# Patient Record
Sex: Male | Born: 1985 | Race: White | Hispanic: No | Marital: Single | Smoking: Never smoker
Health system: Southern US, Community
[De-identification: ages and names within clinical notes are randomized; demographics above are authoritative.]

## PROBLEM LIST (undated history)

## (undated) DIAGNOSIS — K219 Gastro-esophageal reflux disease without esophagitis: Secondary | ICD-10-CM

## (undated) DIAGNOSIS — F419 Anxiety disorder, unspecified: Secondary | ICD-10-CM

## (undated) HISTORY — DX: Anxiety disorder, unspecified: F41.9

## (undated) HISTORY — PX: OTHER SURGICAL HISTORY: SHX169

## (undated) HISTORY — DX: Gastro-esophageal reflux disease without esophagitis: K21.9

---

## 2010-08-08 HISTORY — PX: DISTAL CLAVICLE EXCISION: SHX1463

## 2017-06-22 ENCOUNTER — Encounter (HOSPITAL_COMMUNITY): Payer: Self-pay | Admitting: Family Medicine

## 2017-06-22 ENCOUNTER — Ambulatory Visit (HOSPITAL_COMMUNITY)
Admission: EM | Admit: 2017-06-22 | Discharge: 2017-06-22 | Disposition: A | Discharge status: Home / Self Care | Payer: BLUE CROSS/BLUE SHIELD | Attending: Emergency Medicine | Admitting: Emergency Medicine

## 2017-06-22 DIAGNOSIS — N485 Ulcer of penis: Secondary | ICD-10-CM | POA: Insufficient documentation

## 2017-06-22 DIAGNOSIS — Z880 Allergy status to penicillin: Secondary | ICD-10-CM | POA: Insufficient documentation

## 2017-06-22 DIAGNOSIS — N5089 Other specified disorders of the male genital organs: Secondary | ICD-10-CM

## 2017-06-22 MED ORDER — AZITHROMYCIN 250 MG PO TABS: ORAL_TABLET | ORAL | 0 refills | Status: DC

## 2017-06-22 NOTE — ED Triage Notes (Signed)
Pt here for possible abrasion or cut to penis. Reports thinks its friction based.

## 2017-06-22 NOTE — Discharge Instructions (Signed)
Have obtained test to help identify the source of the lesion. The diagnosis is uncertain. One possibility is chancroid. It can be treated with azithromycin. That is why you are receiving a prescription for this today.

## 2017-06-22 NOTE — ED Provider Notes (Signed)
[facility]    CSN: [id number] Arrival date & time: 06/22/17  1007     History   Chief Complaint Chief Complaint  Patient presents with  . Abrasion    HPI Brent Long is a 31 y.o. male.   31 year old male with penile abrasion for 2 weeks. He states that he believes it is due to friction with intercourse as this tends to exacerbate the symptoms and pain. The "abrasion" is a ovoid ulcer located at the base of the corona. No other lesions. Denies fatigue or malaise or feeling sick. No fever or chills. He states the lesion is tender but not particulary painful.      History reviewed. No pertinent past medical history.  There are no active problems to display for this patient.   History reviewed. No pertinent surgical history.     Home Medications    Prior to Admission medications   Medication Sig Start Date End Date Taking? Authorizing Provider  azithromycin (ZITHROMAX) 250 MG tablet 2 tabs po on day one, then one tablet po once daily on days 2-5. 06/22/17   Hayden RasmussenMabe, Lorena Clearman, NP    Family History Family History  Problem Relation Age of Onset  . Cancer Mother   . Cancer Father     Social History Social History   Tobacco Use  . Smoking status: Never Smoker  . Smokeless tobacco: Never Used  Substance Use Topics  . Alcohol use: Not on file  . Drug use: Not on file     Allergies   Penicillins   Review of Systems Review of Systems  Constitutional: Negative.   Genitourinary: Positive for genital sores. Negative for discharge, dysuria, flank pain, frequency, penile pain, penile swelling, scrotal swelling, testicular pain and urgency.  All other systems reviewed and are negative.    Physical Exam Triage Vital Signs ED Triage Vitals [06/22/17 1024]  Enc Vitals Group     BP (!) 146/85     Pulse Rate 81     Resp 18     Temp 98.5 F (36.9 C)     Temp src      SpO2 100 %     Weight      Height      Head Circumference      Peak Flow      Pain  Score      Pain Loc      Pain Edu?      Excl. in GC?    No data found.  Updated Vital Signs BP (!) 146/85   Pulse 81   Temp 98.5 F (36.9 C)   Resp 18   SpO2 100%   Visual Acuity Right Eye Distance:   Left Eye Distance:   Bilateral Distance:    Right Eye Near:   Left Eye Near:    Bilateral Near:     Physical Exam  Constitutional: He is oriented to person, place, and time. He appears well-developed and well-nourished. No distress.  Eyes: EOM are normal.  Neck: Normal range of motion. Neck supple.  Cardiovascular: Normal rate.  Pulmonary/Chest: Effort normal. No respiratory distress.  Genitourinary:  Genitourinary Comments: Normal phallus and normal external male genitalia. There is a ovoid ulcerative lesion at the base of the corona measuring approximately 5 mm in diameter. Mildly erythematous. Positive for tenderness. No bleeding. No lymphangitis. No regional lymphadenopathy. No scrotal or testicular pain or tenderness. No epididymal tenderness. No penile discharge.  Musculoskeletal: He exhibits no edema.  Neurological: He is  alert and oriented to person, place, and time. He exhibits normal muscle tone.  Skin: Skin is warm and dry.  Psychiatric: He has a normal mood and affect.  Nursing note and vitals reviewed.    UC Treatments / Results  Labs (all labs ordered are listed, but only abnormal results are displayed) Labs Reviewed  HSV CULTURE AND TYPING  RPR  HIV ANTIBODY (ROUTINE TESTING)    EKG  EKG Interpretation None       Radiology No results found.  Procedures Procedures (including critical care time)  Medications Ordered in UC Medications - No data to display   Initial Impression / Assessment and Plan / UC Course  I have reviewed the triage vital signs and the nursing notes.  Pertinent labs & imaging results that were available during my care of the patient were reviewed by me and considered in my medical decision making (see chart for  details).    Have obtained test to help identify the source of the lesion. The diagnosis is uncertain. One possibility is chancroid. It can be treated with azithromycin. That is why you are receiving a prescription for this today.     Final Clinical Impressions(s) / UC Diagnoses   Final diagnoses:  Ulcers of genital organ in male    ED Discharge Orders        Ordered    azithromycin (ZITHROMAX) 250 MG tablet     06/22/17 1113       Controlled Substance Prescriptions [place] Controlled Substance Registry consulted? Not Applicable   Hayden RasmussenMabe, Hellon Vaccarella, NP 06/22/17 1126

## 2017-06-23 LAB — HIV ANTIBODY (ROUTINE TESTING W REFLEX): HIV SCREEN 4TH GENERATION: NONREACTIVE

## 2017-06-23 LAB — RPR: RPR Ser Ql: NONREACTIVE

## 2017-06-24 ENCOUNTER — Encounter (HOSPITAL_COMMUNITY): Payer: Self-pay | Admitting: Family Medicine

## 2017-06-24 ENCOUNTER — Ambulatory Visit (HOSPITAL_COMMUNITY)
Admission: EM | Admit: 2017-06-24 | Discharge: 2017-06-24 | Disposition: A | Discharge status: Home / Self Care | Payer: BLUE CROSS/BLUE SHIELD | Attending: Family Medicine | Admitting: Family Medicine

## 2017-06-24 DIAGNOSIS — R509 Fever, unspecified: Secondary | ICD-10-CM | POA: Diagnosis not present

## 2017-06-24 DIAGNOSIS — J020 Streptococcal pharyngitis: Secondary | ICD-10-CM | POA: Diagnosis not present

## 2017-06-24 DIAGNOSIS — J029 Acute pharyngitis, unspecified: Secondary | ICD-10-CM | POA: Diagnosis not present

## 2017-06-24 LAB — POCT INFECTIOUS MONO SCREEN: Mono Screen: NEGATIVE

## 2017-06-24 LAB — POCT RAPID STREP A: STREPTOCOCCUS, GROUP A SCREEN (DIRECT): POSITIVE — AB

## 2017-06-24 MED ORDER — DEXAMETHASONE SODIUM PHOSPHATE 10 MG/ML IJ SOLN
INTRAMUSCULAR | Status: AC
  Filled 2017-06-24: qty 1

## 2017-06-24 MED ORDER — ACETAMINOPHEN 160 MG/5ML PO SOLN
ORAL | Status: AC
  Filled 2017-06-24: qty 20.3

## 2017-06-24 MED ORDER — DEXAMETHASONE SODIUM PHOSPHATE 10 MG/ML IJ SOLN
10.0000 mg | Freq: Once | INTRAMUSCULAR | Status: AC
  Administered 2017-06-24: 10 mg via INTRAMUSCULAR

## 2017-06-24 MED ORDER — PREDNISONE 50 MG PO TABS: ORAL_TABLET | ORAL | 0 refills | Status: DC

## 2017-06-24 MED ORDER — CEFDINIR 300 MG PO CAPS: 300.0000 mg | ORAL_CAPSULE | Freq: Two times a day (BID) | ORAL | 0 refills | Status: DC

## 2017-06-24 MED ORDER — ACETAMINOPHEN 160 MG/5ML PO SOLN
650.0000 mg | Freq: Once | ORAL | Status: AC
  Administered 2017-06-24: 650 mg via ORAL

## 2017-06-24 NOTE — ED Triage Notes (Signed)
Pt here for fever, sore throat for over a week and worsening. Reports very enlarged uvula

## 2017-06-24 NOTE — Discharge Instructions (Signed)
Stop taking the azithromycin now. [facility]. He will also have a prescription for prednisone. This is in case you break out into a rash or have itching. If you develop increased swelling of the throat, difficult to swallow or breathing call 911 or go to emergency department.

## 2017-06-24 NOTE — ED Provider Notes (Signed)
[facility]    CSN: [id number] Arrival date & time: 06/24/17  1856     History   Chief Complaint Chief Complaint  Patient presents with  . Sore Throat    HPI Brent Long is a 31 y.o. male.   31 year old male presents with sore throat, swelling of the uvula and posterior pharynx. He was seen for what was believed to be strep throat without a positive strep about 2 weeks ago and treated with Biaxin. It is reported that the culture for strep was negative. He was later seen in this urgent care for a lesion to the penis and test performed performed and azithromycin was prescribed as a Z-Pak for possible chancroid. He returns today on the third dose of his azithromycin with pharyngitis. No trouble breathing. Airway widely patent      History reviewed. No pertinent past medical history.  There are no active problems to display for this patient.   History reviewed. No pertinent surgical history.     Home Medications    Prior to Admission medications   Medication Sig Start Date End Date Taking? Authorizing Provider  cefdinir (OMNICEF) 300 MG capsule Take 1 capsule (300 mg total) 2 (two) times daily by mouth. 06/24/17   Alberto Pina, Onalee Huaavid, NP  predniSONE (DELTASONE) 50 MG tablet 1 tab po daily for 6 days. Take with food. 06/24/17   Hayden RasmussenMabe, Geraline Halberstadt, NP    Family History Family History  Problem Relation Age of Onset  . Cancer Mother   . Cancer Father     Social History Social History   Tobacco Use  . Smoking status: Never Smoker  . Smokeless tobacco: Never Used  Substance Use Topics  . Alcohol use: Not on file  . Drug use: Not on file     Allergies   Penicillins   Review of Systems Review of Systems  Constitutional: Positive for fever.  HENT: Positive for sore throat.   Respiratory: Negative.   Neurological: Negative.   All other systems reviewed and are negative.    Physical Exam Triage Vital Signs ED Triage Vitals  Enc Vitals Group     BP 06/24/17  1907 (!) 155/101     Pulse Rate 06/24/17 1907 (!) 123     Resp 06/24/17 1907 18     Temp 06/24/17 1907 (!) 101 F (38.3 C)     Temp src --      SpO2 06/24/17 1907 99 %     Weight --      Height --      Head Circumference --      Peak Flow --      Pain Score 06/24/17 1915 10     Pain Loc --      Pain Edu? --      Excl. in GC? --    No data found.  Updated Vital Signs BP (!) 155/101   Pulse (!) 123   Temp (!) 101 F (38.3 C)   Resp 18   SpO2 99%   Visual Acuity Right Eye Distance:   Left Eye Distance:   Bilateral Distance:    Right Eye Near:   Left Eye Near:    Bilateral Near:     Physical Exam  Constitutional: He is oriented to person, place, and time. He appears well-developed and well-nourished.  Non-toxic appearance. He does not appear ill. No distress.  HENT:  Mouth/Throat: Uvula swelling present. Posterior oropharyngeal erythema present. No oropharyngeal exudate or tonsillar abscesses. No tonsillar exudate.  Neck: Neck supple.  Pulmonary/Chest: Effort normal and breath sounds normal.  Lymphadenopathy:    He has cervical adenopathy.  Neurological: He is alert and oriented to person, place, and time.  Skin: Skin is warm and dry. No rash noted.  Psychiatric: He has a normal mood and affect.  Nursing note and vitals reviewed.    UC Treatments / Results  Labs (all labs ordered are listed, but only abnormal results are displayed) Labs Reviewed  POCT RAPID STREP A - Abnormal; Notable for the following components:      Result Value   Streptococcus, Group A Screen (Direct) POSITIVE (*)    All other components within normal limits  POCT INFECTIOUS MONO SCREEN  Allergy PCN with urticaria. Currently in the middle of second course of macrolides for pharyngitis in 2 weeks with worsening , erythema, mild swelling and discomfort. EKG  EKG Interpretation None       Radiology No results found.  Procedures Procedures (including critical care  time)  Medications Ordered in UC Medications  acetaminophen (TYLENOL) solution 650 mg (650 mg Oral Given 06/24/17 1920)  dexamethasone (DECADRON) injection 10 mg (10 mg Intramuscular Given 06/24/17 1933)     Initial Impression / Assessment and Plan / UC Course  I have reviewed the triage vital signs and the nursing notes.  Pertinent labs & imaging results that were available during my care of the patient were reviewed by me and considered in my medical decision making (see chart for details).       Final Clinical Impressions(s) / UC Diagnoses   Final diagnoses:  Strep pharyngitis    ED Discharge Orders        Ordered    cefdinir (OMNICEF) 300 MG capsule  2 times daily     06/24/17 1953    predniSONE (DELTASONE) 50 MG tablet     06/24/17 1953     Management discussed with Dr. Tracie HarrierHagler suggesting the above for treatment.  Controlled Substance Prescriptions [place] Controlled Substance Registry consulted? Not Applicable   Hayden RasmussenMabe, Rahkeem Senft, NP 06/24/17 [phone]

## 2017-06-25 LAB — HSV CULTURE AND TYPING

## 2017-12-18 ENCOUNTER — Ambulatory Visit: Payer: Self-pay | Admitting: Medical

## 2021-02-14 NOTE — Progress Notes (Signed)
New Patient Office Visit  Subjective:  Patient ID: Brent Long, male    DOB: 06-01-1986  Age: 35 y.o. MRN: [id number]  CC: Pulmonary embolism post-ER follow up   HPI Brent Long presents for a new patient consultation regarding a new diagnosis of acute pulmonary embolism. He has a past medical history of obesity, GERD, generalized anxiety and ADHD. On 02/07/21 he presented to the [facility] ER via private vehicle after a syncopal event while out on a trail with his family after attempting to lift a fallen log. After attempting to lift the fallen tree, he lost consciousness for a few minutes before coming to. He was driven to the trail entrance on a [place] and a family member drove him to the ER. In the ER he was anticoagulated with Xarelto and has been taking for 8 days as prescribed. He states that a few days prior to the event he was feeling more fatigued, with significant night sweats, headaches and was dyspneic prior to LOC. He tested positive for COVID in 2020 and recovered fully after 3 days. He has since received 2 doses of the [employer] vaccine and no known sick contacts preceding event. He works remotely from home and reports a sedentary lifestyle with poor dietary choices. He denies tobacco use and reports less than 1 alcoholic beverage per month on average.   Past Medical History:  Diagnosis Date   Anxiety    GERD (gastroesophageal reflux disease)     Past Surgical History:  Procedure Laterality Date   DISTAL CLAVICLE EXCISION  2012   jones fracture      Family History  Problem Relation Age of Onset   Cancer Mother    Cancer Father     Social History   Socioeconomic History   Marital status: Single    Spouse name: Not on file   Number of children: Not on file   Years of education: Not on file   Highest education level: Not on file  Occupational History   Not on file  Tobacco Use   Smoking status: Never   Smokeless tobacco: Never  Vaping Use   Vaping Use: Never used   Substance and Sexual Activity   Alcohol use: Yes    Comment: <1 drink per month on average   Drug use: Never   Sexual activity: Not on file  Other Topics Concern   Not on file  Social History Narrative   Not on file   Social Determinants of Health   Financial Resource Strain: Not on file  Food Insecurity: Not on file  Transportation Needs: Not on file  Physical Activity: Not on file  Stress: Not on file  Social Connections: Not on file  Intimate Partner Violence: Not on file    ROS Review of Systems  Constitutional:  Positive for fatigue. Negative for activity change and fever.  HENT: Negative.    Eyes:  Negative for photophobia and visual disturbance.  Respiratory:  Negative for apnea, cough, choking, chest tightness, shortness of breath and wheezing.        Snoring  Cardiovascular:  Positive for palpitations and leg swelling. Negative for chest pain.  Gastrointestinal:  Negative for abdominal distention, abdominal pain, anal bleeding, blood in stool and rectal pain.  Endocrine: Negative for cold intolerance, heat intolerance, polydipsia, polyphagia and polyuria.  Genitourinary: Negative.  Negative for scrotal swelling and testicular pain.  Musculoskeletal:  Negative for joint swelling.  Skin:  Positive for rash.  Allergic/Immunologic: Negative for environmental allergies  and food allergies.  Neurological:  Positive for syncope and headaches. Negative for dizziness, tremors, seizures, light-headedness and numbness.  Hematological:  Negative for adenopathy. Does not bruise/bleed easily.  Psychiatric/Behavioral:  Negative for dysphoric mood, self-injury, sleep disturbance and suicidal ideas. The patient is not nervous/anxious.    Objective:   Today's Vitals: BP 115/76   Ht 5\' 10"  (1.778 m)   Wt 255 lb (115.7 kg)   SpO2 98%   BMI 36.59 kg/m   Physical Exam Constitutional:      General: He is not in acute distress.    Appearance: He is obese. He is diaphoretic.   HENT:     Head: Normocephalic and atraumatic.     Mouth/Throat:     Pharynx: Oropharynx is clear.  Eyes:     Pupils: Pupils are equal, round, and reactive to light.  Cardiovascular:     Rate and Rhythm: Regular rhythm. Tachycardia present.     Pulses: Normal pulses.     Heart sounds: Normal heart sounds.  Pulmonary:     Effort: Pulmonary effort is normal.     Breath sounds: Normal breath sounds.  Abdominal:     General: Bowel sounds are normal.     Palpations: Abdomen is soft.  Musculoskeletal:     Cervical back: Neck supple.     Right lower leg: No edema.     Left lower leg: No edema.  Neurological:     General: No focal deficit present.     Mental Status: He is alert.  Psychiatric:        Mood and Affect: Mood normal.        Behavior: Behavior normal.  ECG sinus tachy      Assessment & Plan:  Acute pulmonary embolism  EKG in office demonstrates sinus tachycardia. Lead III shows deep Q waves and inverted T waves. No ST segment elevation nor depression.  Xarelto was refilled and patient instructed to increase to 20mg  daily after finishing starter pack. Coagulation panel ordered and obtained. Referral to hematology for management of anticoagulant. Return to office in 3 weeks for a follow up. He is not to exercise or engage in any strenuous activity until cleared by Physician. Obtain records from ED re PE CT scan study  Obesity Discussed with patient the importance of a healthy, balanced diet and gave handout. Also discussed proper hydration. Patient was screened for diabetes.    Problem List Items Addressed This Visit   None Visit Diagnoses     Multiple subsegmental pulmonary emboli without acute cor pulmonale (HCC)    -  Primary   Relevant Medications   XARELTO STARTER PACK   rivaroxaban (XARELTO) 20 MG TABS tablet   Other Relevant Orders   ECHOCARDIOGRAM COMPLETE   VAS LOWER EXTREMITY VENOUS (DVT)   CBC with Differential/Platelet   Comprehensive metabolic panel    Antithrombin III   Protein C activity   Protein S activity   Protein S, total   Lupus anticoagulant panel   Factor 5 leiden   Prothrombin gene mutation   Cardiolipin antibodies, IgG, IgM, IgA   EKG 12-Lead   Protein C, total   Beta-2-glycoprotein i abs, IgG/M/A   Ambulatory referral to Hematology / Oncology   Encounter for health-related screening       Relevant Orders   Hemoglobin A1c   Need for hepatitis C screening test       Relevant Orders   HCV Ab w Reflex to Quant PCR  Outpatient Encounter Medications as of 02/15/2021  Medication Sig   rivaroxaban (XARELTO) 20 MG TABS tablet Take 1 tablet (20 mg total) by mouth daily with supper.   XARELTO STARTER PACK See admin instructions. see package   methylphenidate 27 MG PO CR tablet Take 27 mg by mouth daily. (Patient not taking: Reported on 02/15/2021)   omeprazole (PRILOSEC) 20 MG capsule Take 20 mg by mouth daily. (Patient not taking: Reported on 02/15/2021)   predniSONE (DELTASONE) 50 MG tablet 1 tab po daily for 6 days. Take with food. (Patient not taking: Reported on 02/15/2021)   sertraline (ZOLOFT) 100 MG tablet Take 100 mg by mouth daily. (Patient not taking: Reported on 02/15/2021)   [DISCONTINUED] cefdinir (OMNICEF) 300 MG capsule Take 1 capsule (300 mg total) 2 (two) times daily by mouth. (Patient not taking: Reported on 02/15/2021)   No facility-administered encounter medications on file as of 02/15/2021.    Follow-up: Return in about 3 weeks (around 03/08/2021).   Shan Levans, MD

## 2021-02-15 ENCOUNTER — Encounter: Payer: Self-pay | Admitting: Critical Care Medicine

## 2021-02-15 ENCOUNTER — Other Ambulatory Visit: Payer: Self-pay

## 2021-02-15 ENCOUNTER — Ambulatory Visit: Payer: Managed Care, Other (non HMO) | Attending: Critical Care Medicine | Admitting: Critical Care Medicine

## 2021-02-15 VITALS — BP 115/76 | Ht 70.0 in | Wt 255.0 lb

## 2021-02-15 DIAGNOSIS — Z6836 Body mass index (BMI) 36.0-36.9, adult: Secondary | ICD-10-CM

## 2021-02-15 DIAGNOSIS — E669 Obesity, unspecified: Secondary | ICD-10-CM | POA: Insufficient documentation

## 2021-02-15 DIAGNOSIS — Z7901 Long term (current) use of anticoagulants: Secondary | ICD-10-CM | POA: Diagnosis not present

## 2021-02-15 DIAGNOSIS — Z139 Encounter for screening, unspecified: Secondary | ICD-10-CM

## 2021-02-15 DIAGNOSIS — Z8616 Personal history of COVID-19: Secondary | ICD-10-CM | POA: Diagnosis not present

## 2021-02-15 DIAGNOSIS — I2694 Multiple subsegmental pulmonary emboli without acute cor pulmonale: Secondary | ICD-10-CM | POA: Insufficient documentation

## 2021-02-15 DIAGNOSIS — Z79899 Other long term (current) drug therapy: Secondary | ICD-10-CM | POA: Diagnosis not present

## 2021-02-15 DIAGNOSIS — Z1159 Encounter for screening for other viral diseases: Secondary | ICD-10-CM | POA: Diagnosis not present

## 2021-02-15 MED ORDER — RIVAROXABAN 20 MG PO TABS: 20.0000 mg | ORAL_TABLET | Freq: Every day | ORAL | 4 refills | Status: DC

## 2021-02-15 NOTE — Patient Instructions (Signed)
Stay on Xarelto when you reach the 20 mg daily dose stay on that and do not change further, the refill be a 20 mg tablet bottle to take 1 daily  Complete set of lab draws obtained today including a hypercoagulable panel  A referral to Dr. Mancel Bale of hematology will be made to assess your blood clotting  An ultrasound of the heart will be obtained  Doppler ultrasounds of both legs will be made  Follow the healthy diet as we discussed see attached  Hydrate yourself significantly you are very volume depleted and we need to get you to stay on a healthier diet per our discussions  Return to see Dr. Delford Field in 3 weeks

## 2021-02-15 NOTE — Progress Notes (Signed)
PE Red spots on arms.

## 2021-02-18 ENCOUNTER — Telehealth: Payer: Self-pay | Admitting: Critical Care Medicine

## 2021-02-18 DIAGNOSIS — I2694 Multiple subsegmental pulmonary emboli without acute cor pulmonale: Secondary | ICD-10-CM

## 2021-02-18 NOTE — Telephone Encounter (Signed)
Received records from [facility]  Report from PE shows only "possible segmental defect Right apical pulmonary artery. "  Need confirmatory CT angio, there was bolus timing issues  Patient is aware  Brent Long need to schedule this study , can be elective next week , not urgent

## 2021-02-19 NOTE — Telephone Encounter (Signed)
Pt is scheduled for CT February 23, 2021 at [facility] at 12pm. Pt is to arrive at 1145am pt can only have liquids 4 hours prior.

## 2021-02-19 NOTE — Telephone Encounter (Signed)
Contacted pt to see if any day and anytime will work for [employer]. Pt states any day and time. Asked pt if he able to log on to his Mychart pt states he does. Made pt aware that appt will pop up on his mychart and if he has any questions or concerns to give [place] a call

## 2021-02-23 ENCOUNTER — Ambulatory Visit (HOSPITAL_BASED_OUTPATIENT_CLINIC_OR_DEPARTMENT_OTHER): Payer: Managed Care, Other (non HMO)

## 2021-02-23 ENCOUNTER — Ambulatory Visit (HOSPITAL_COMMUNITY): Payer: Managed Care, Other (non HMO)

## 2021-02-23 LAB — COMPREHENSIVE METABOLIC PANEL
ALT: 79 IU/L — ABNORMAL HIGH (ref 0–44)
AST: 65 IU/L — ABNORMAL HIGH (ref 0–40)
Albumin/Globulin Ratio: 1.2 (ref 1.2–2.2)
Albumin: 4.1 g/dL (ref 4.0–5.0)
Alkaline Phosphatase: 91 IU/L (ref 44–121)
BUN/Creatinine Ratio: 6 — ABNORMAL LOW (ref 9–20)
BUN: 7 mg/dL (ref 6–20)
Bilirubin Total: 0.6 mg/dL (ref 0.0–1.2)
CO2: 21 mmol/L (ref 20–29)
Calcium: 8.8 mg/dL (ref 8.7–10.2)
Chloride: 96 mmol/L (ref 96–106)
Creatinine, Ser: 1.08 mg/dL (ref 0.76–1.27)
Globulin, Total: 3.3 g/dL (ref 1.5–4.5)
Glucose: 108 mg/dL — ABNORMAL HIGH (ref 65–99)
Potassium: 3.9 mmol/L (ref 3.5–5.2)
Sodium: 137 mmol/L (ref 134–144)
Total Protein: 7.4 g/dL (ref 6.0–8.5)
eGFR: 91 mL/min/{1.73_m2} (ref >59)

## 2021-02-23 LAB — CBC WITH DIFFERENTIAL/PLATELET
Basophils Absolute: 0.1 10*3/uL (ref 0.0–0.2)
Basos: 1 %
EOS (ABSOLUTE): 0.1 10*3/uL (ref 0.0–0.4)
Eos: 1 %
Hematocrit: 38.3 % (ref 37.5–51.0)
Hemoglobin: 13 g/dL (ref 13.0–17.7)
Immature Grans (Abs): 0.1 10*3/uL (ref 0.0–0.1)
Immature Granulocytes: 1 %
Lymphocytes Absolute: 7 10*3/uL — ABNORMAL HIGH (ref 0.7–3.1)
Lymphs: 66 %
MCH: 28.1 pg (ref 26.6–33.0)
MCHC: 33.9 g/dL (ref 31.5–35.7)
MCV: 83 fL (ref 79–97)
Monocytes Absolute: 0.6 10*3/uL (ref 0.1–0.9)
Monocytes: 6 %
Neutrophils Absolute: 2.6 10*3/uL (ref 1.4–7.0)
Neutrophils: 25 %
Platelets: 129 10*3/uL — ABNORMAL LOW (ref 150–450)
RBC: 4.62 x10E6/uL (ref 4.14–5.80)
RDW: 12.9 % (ref 11.6–15.4)
WBC: 10.6 10*3/uL (ref 3.4–10.8)

## 2021-02-23 LAB — PROTEIN S, TOTAL: Protein S Ag, Total: 146 % (ref 60–150)

## 2021-02-23 LAB — HEMOGLOBIN A1C
Est. average glucose Bld gHb Est-mCnc: 100 mg/dL
Hgb A1c MFr Bld: 5.1 % (ref 4.8–5.6)

## 2021-02-23 LAB — LUPUS ANTICOAGULANT PANEL
Dilute Viper Venom Time: 57.6 s — ABNORMAL HIGH (ref 0.0–47.0)
PTT Lupus Anticoagulant: 49.4 s (ref 0.0–51.9)

## 2021-02-23 LAB — CARDIOLIPIN ANTIBODIES, IGG, IGM, IGA
Anticardiolipin IgA: <9 APL U/mL (ref 0–11)
Anticardiolipin IgG: <9 GPL U/mL (ref 0–14)
Anticardiolipin IgM: 137 MPL U/mL — ABNORMAL HIGH (ref 0–12)

## 2021-02-23 LAB — BETA-2-GLYCOPROTEIN I ABS, IGG/M/A
Beta-2 Glyco 1 IgA: <9 GPI IgA units (ref 0–25)
Beta-2 Glyco 1 IgM: 16 GPI IgM units (ref 0–32)
Beta-2 Glyco I IgG: <9 GPI IgG units (ref 0–20)

## 2021-02-23 LAB — PROTEIN C, TOTAL: Protein C Antigen: 79 % (ref 60–150)

## 2021-02-23 LAB — FACTOR 5 LEIDEN

## 2021-02-23 LAB — PROTHROMBIN GENE MUTATION

## 2021-02-23 LAB — PROTEIN C ACTIVITY: Protein C Activity: 90 % (ref 73–180)

## 2021-02-23 LAB — ANTITHROMBIN III: AntiThromb III Func: 105 % (ref 75–135)

## 2021-02-23 LAB — DRVVT MIX: dRVVT Mix: 50 s — ABNORMAL HIGH (ref 0.0–40.4)

## 2021-02-23 LAB — DRVVT CONFIRM: dRVVT Confirm: 1.1 ratio (ref 0.8–1.2)

## 2021-02-24 ENCOUNTER — Ambulatory Visit (HOSPITAL_BASED_OUTPATIENT_CLINIC_OR_DEPARTMENT_OTHER): Payer: Managed Care, Other (non HMO)

## 2021-02-25 ENCOUNTER — Ambulatory Visit (HOSPITAL_COMMUNITY): Payer: Managed Care, Other (non HMO)

## 2021-02-26 ENCOUNTER — Telehealth: Payer: Self-pay

## 2021-02-26 ENCOUNTER — Ambulatory Visit (HOSPITAL_COMMUNITY): Payer: Managed Care, Other (non HMO)

## 2021-02-26 NOTE — Telephone Encounter (Signed)
Peer to peer has been done by Dr,wright on 02/25/21

## 2021-03-01 NOTE — Telephone Encounter (Signed)
See patients message Brent Long  the CT angio has been approved.  Pls schedule [facility] at [facility]

## 2021-03-02 ENCOUNTER — Ambulatory Visit (INDEPENDENT_AMBULATORY_CARE_PROVIDER_SITE_OTHER): Payer: Managed Care, Other (non HMO)

## 2021-03-02 ENCOUNTER — Other Ambulatory Visit: Payer: Self-pay

## 2021-03-02 DIAGNOSIS — I2694 Multiple subsegmental pulmonary emboli without acute cor pulmonale: Secondary | ICD-10-CM

## 2021-03-02 MED ORDER — IOHEXOL 350 MG/ML SOLN
100.0000 mL | Freq: Once | INTRAVENOUS | Status: AC | PRN
  Administered 2021-03-02: 100 mL via INTRAVENOUS

## 2021-03-03 ENCOUNTER — Ambulatory Visit (HOSPITAL_BASED_OUTPATIENT_CLINIC_OR_DEPARTMENT_OTHER)
Admission: RE | Admit: 2021-03-03 | Discharge: 2021-03-03 | Disposition: A | Discharge status: Home / Self Care | Payer: Managed Care, Other (non HMO) | Source: Ambulatory Visit | Attending: Critical Care Medicine | Admitting: Critical Care Medicine

## 2021-03-03 ENCOUNTER — Ambulatory Visit (HOSPITAL_COMMUNITY)
Admission: RE | Admit: 2021-03-03 | Discharge: 2021-03-03 | Disposition: A | Discharge status: Home / Self Care | Payer: Managed Care, Other (non HMO) | Source: Ambulatory Visit | Attending: Critical Care Medicine | Admitting: Critical Care Medicine

## 2021-03-03 ENCOUNTER — Other Ambulatory Visit: Payer: Self-pay | Admitting: Critical Care Medicine

## 2021-03-03 DIAGNOSIS — I2694 Multiple subsegmental pulmonary emboli without acute cor pulmonale: Secondary | ICD-10-CM | POA: Diagnosis present

## 2021-03-03 LAB — ECHOCARDIOGRAM COMPLETE
AR max vel: 3.61 cm2
AV Area VTI: 3.87 cm2
AV Area mean vel: 3.53 cm2
AV Mean grad: 2 mmHg
AV Peak grad: 3.7 mmHg
Ao pk vel: 0.97 m/s
Area-P 1/2: 3.65 cm2
MV VTI: 2.71 cm2
S' Lateral: 3.1 cm

## 2021-03-03 NOTE — Progress Notes (Signed)
  Echocardiogram 2D Echocardiogram has been performed.  Gerda Diss 03/03/2021, 8:58 AM

## 2021-03-07 NOTE — Progress Notes (Signed)
New Patient Office Visit  Subjective:  Patient ID: Brent Long, male    DOB: [birth year]  Age: 35 y.o. MRN: [id number]  CC: Pulmonary embolism post-ER follow up   HPI 02/15/21 Brent Long presents for a new patient consultation regarding a new diagnosis of acute pulmonary embolism. He has a past medical history of obesity, GERD, generalized anxiety and ADHD. On 02/07/21 he presented to the [facility] ER via private vehicle after a syncopal event while out on a trail with his family after attempting to lift a fallen log. After attempting to lift the fallen tree, he lost consciousness for a few minutes before coming to. He was driven to the trail entrance on a [place] and a family member drove him to the ER. In the ER he was anticoagulated with Xarelto and has been taking for 8 days as prescribed. He states that a few days prior to the event he was feeling more fatigued, with significant night sweats, headaches and was dyspneic prior to LOC. He tested positive for COVID in 2020 and recovered fully after 3 days. He has since received 2 doses of the [employer] vaccine and no known sick contacts preceding event. He works remotely from home and reports a sedentary lifestyle with poor dietary choices. He denies tobacco use and reports less than 1 alcoholic beverage per month on average.   03/08/21 This patient is seen in return follow-up and since the last visit he has been doing well.  He has had no further episodes of syncope.  He is not short of breath having no chest pain.  He is lost about 5 pounds of weight he is exercising more.  He is getting about 10,000 steps in a day.  He has purchased a device that elevates his workstation as he does work from home for an [employer].  He is now standing for several several hours a day while working at his workstation. He is also drinking large amounts of water at this time.  The only issues with his son he will occasionally go to fast food restaurants such as  [employer] at night for dinner Patient does complain of daytime mild hypersomnolence and does complain of snoring and dry mouth.  He is interested in a sleep evaluation. Past Medical History:  Diagnosis Date   Anxiety    GERD (gastroesophageal reflux disease)     Past Surgical History:  Procedure Laterality Date   DISTAL CLAVICLE EXCISION  2012   jones fracture      Family History  Problem Relation Age of Onset   Cancer Mother    Cancer Father     Social History   Socioeconomic History   Marital status: Single    Spouse name: Not on file   Number of children: Not on file   Years of education: Not on file   Highest education level: Not on file  Occupational History   Not on file  Tobacco Use   Smoking status: Never   Smokeless tobacco: Never  Vaping Use   Vaping Use: Never used  Substance and Sexual Activity   Alcohol use: Yes    Comment: <1 drink per month on average   Drug use: Never   Sexual activity: Not on file  Other Topics Concern   Not on file  Social History Narrative   Not on file   Social Determinants of Health   Financial Resource Strain: Not on file  Food Insecurity: Not on file  Transportation Needs: Not  on file  Physical Activity: Not on file  Stress: Not on file  Social Connections: Not on file  Intimate Partner Violence: Not on file    ROS Review of Systems  Constitutional:  Negative for activity change, fatigue and fever.  HENT: Negative.    Eyes:  Negative for photophobia and visual disturbance.  Respiratory:  Negative for apnea, cough, choking, chest tightness, shortness of breath and wheezing.        Snoring  Cardiovascular:  Negative for chest pain, palpitations and leg swelling.  Gastrointestinal:  Negative for abdominal distention, abdominal pain, anal bleeding, blood in stool and rectal pain.  Endocrine: Negative for cold intolerance, heat intolerance, polydipsia, polyphagia and polyuria.  Genitourinary: Negative.  Negative  for scrotal swelling and testicular pain.  Musculoskeletal:  Negative for joint swelling.  Skin:  Negative for rash.  Allergic/Immunologic: Negative for environmental allergies and food allergies.  Neurological:  Negative for dizziness, tremors, seizures, syncope, light-headedness, numbness and headaches.  Hematological:  Negative for adenopathy. Does not bruise/bleed easily.  Psychiatric/Behavioral:  Negative for dysphoric mood, self-injury, sleep disturbance and suicidal ideas. The patient is not nervous/anxious.    Objective:   Today's Vitals: BP 131/85   Pulse 96   Wt 251 lb (113.9 kg)   SpO2 99%   BMI 36.01 kg/m   Physical Exam Constitutional:      General: He is not in acute distress.    Appearance: Normal appearance. He is obese. He is not diaphoretic.  HENT:     Head: Normocephalic and atraumatic.     Mouth/Throat:     Pharynx: Oropharynx is clear.  Eyes:     Pupils: Pupils are equal, round, and reactive to light.  Cardiovascular:     Rate and Rhythm: Normal rate and regular rhythm.     Pulses: Normal pulses.     Heart sounds: Normal heart sounds.  Pulmonary:     Effort: Pulmonary effort is normal.     Breath sounds: Normal breath sounds.  Abdominal:     General: Bowel sounds are normal.     Palpations: Abdomen is soft.  Musculoskeletal:     Cervical back: Neck supple.     Right lower leg: No edema.     Left lower leg: No edema.  Neurological:     General: No focal deficit present.     Mental Status: He is alert.  Psychiatric:        Mood and Affect: Mood normal.        Behavior: Behavior normal.    Repeat CT Angio Chest 7/26 IMPRESSION: Negative for significant acute pulmonary embolus by CTA.   Minor dependent basilar atelectasis.   No other acute intrathoracic finding.     Venous doppler 7/27 RIGHT: - There is no evidence of deep vein thrombosis in the lower extremity. - No cystic structure found in the popliteal fossa. LEFT: - There is no  evidence of deep vein thrombosis in the lower extremity. - No cystic structure found in the popliteal fossa.  7/27 Echo IMPRESSIONS     1. Left ventricular ejection fraction, by estimation, is 55 to 60%. The  left ventricle has normal function. The left ventricle has no regional  wall motion abnormalities. Left ventricular diastolic parameters were  normal.   2. Right ventricular systolic function is normal. The right ventricular  size is normal. Tricuspid regurgitation signal is inadequate for assessing  PA pressure.   3. The mitral valve is normal in structure. Trivial mitral valve  regurgitation. No evidence of mitral stenosis.   4. The aortic valve was not well visualized. Aortic valve regurgitation  is not visualized. No aortic stenosis is present.   5. The inferior vena cava is normal in size with greater than 50%  respiratory variability, suggesting right atrial pressure of 3 mmHg.   Assessment & Plan:  Acute pulmonary embolism  EKG in office demonstrates sinus tachycardia. Lead III shows deep Q waves and inverted T waves. No ST segment elevation nor depression.  Xarelto was refilled and patient instructed to increase to 20mg  daily after finishing starter pack. Coagulation panel ordered and obtained. Referral to hematology for management of anticoagulant. Return to office in 3 weeks for a follow up. He is not to exercise or engage in any strenuous activity until cleared by Physician. Obtain records from ED re PE CT scan study  Obesity Discussed with patient the importance of a healthy, balanced diet and gave handout. Also discussed proper hydration. Patient was screened for diabetes.    Problem List Items Addressed This Visit       Cardiovascular and Mediastinum   RESOLVED: Multiple subsegmental pulmonary emboli without acute cor pulmonale (HCC) - Primary    We have conclusively ruled out pulmonary embolism in this patient the only remaining issue is to see if his D-dimer is  still elevated I will repeat this at this visit  His repeat CT angio and venous Doppler studies were completely normal echo was normal at this time we have stopped the Xarelto and I do not believe he needs further evaluation or treatments       Relevant Orders   D-dimer, quantitative     Digestive   GERD (gastroesophageal reflux disease)    Very mild reflux symptoms I gave the patient a reflux diet to add to his regimen       Relevant Medications   pantoprazole (PROTONIX) 40 MG tablet     Other   BMI 36.0-36.9,adult    Patient is currently working on a weight loss program I gave him continued encouragement and more dietary instructions       Snoring    Snoring without significant other symptoms however patient would is interested in sleep evaluation we will make referral       Other Visit Diagnoses     Need for hepatitis C screening test       Relevant Orders   HCV Ab w Reflex to Quant PCR   Elevated LFTs       Relevant Orders   Hepatic Function Panel      Outpatient Encounter Medications as of 03/08/2021  Medication Sig   pantoprazole (PROTONIX) 40 MG tablet Take 40 mg by mouth daily as needed.   No facility-administered encounter medications on file as of 03/08/2021.  Patient agrees to a tetanus vaccine at this visit we will also screening for hepatitis C for primary care  I do not plan on seeing this patient back in return instead I will find him a good internist for primary care in the [facility] area  Follow-up: No follow-ups on file.   [facility], MD

## 2021-03-08 ENCOUNTER — Other Ambulatory Visit: Payer: Self-pay

## 2021-03-08 ENCOUNTER — Ambulatory Visit: Payer: Managed Care, Other (non HMO) | Attending: Critical Care Medicine | Admitting: Critical Care Medicine

## 2021-03-08 ENCOUNTER — Encounter: Payer: Self-pay | Admitting: Critical Care Medicine

## 2021-03-08 VITALS — BP 131/85 | HR 96 | Wt 251.0 lb

## 2021-03-08 DIAGNOSIS — Z6836 Body mass index (BMI) 36.0-36.9, adult: Secondary | ICD-10-CM

## 2021-03-08 DIAGNOSIS — I2694 Multiple subsegmental pulmonary emboli without acute cor pulmonale: Secondary | ICD-10-CM

## 2021-03-08 DIAGNOSIS — Z23 Encounter for immunization: Secondary | ICD-10-CM

## 2021-03-08 DIAGNOSIS — R0683 Snoring: Secondary | ICD-10-CM

## 2021-03-08 DIAGNOSIS — K219 Gastro-esophageal reflux disease without esophagitis: Secondary | ICD-10-CM

## 2021-03-08 DIAGNOSIS — R7989 Other specified abnormal findings of blood chemistry: Secondary | ICD-10-CM

## 2021-03-08 DIAGNOSIS — Z1159 Encounter for screening for other viral diseases: Secondary | ICD-10-CM

## 2021-03-08 NOTE — Assessment & Plan Note (Signed)
Snoring without significant other symptoms however patient would is interested in sleep evaluation we will make referral

## 2021-03-08 NOTE — Patient Instructions (Signed)
A tetanus vaccine was given  Stop by the lab to get a hepatitis C liver function profile and D-dimer we will call you with results  I will identify for your primary care doctor in the [facility] area and make a referral and let you know who we connect you with  Continue to follow a healthy diet as outlined on the attachment  Continue your exercise program congratulations on your weight loss  Return to see Dr. Delford Field as needed

## 2021-03-08 NOTE — Assessment & Plan Note (Signed)
Patient is currently working on a weight loss program I gave him continued encouragement and more dietary instructions

## 2021-03-08 NOTE — Assessment & Plan Note (Signed)
Very mild reflux symptoms I gave the patient a reflux diet to add to his regimen

## 2021-03-08 NOTE — Assessment & Plan Note (Signed)
We have conclusively ruled out pulmonary embolism in this patient the only remaining issue is to see if his D-dimer is still elevated I will repeat this at this visit  His repeat CT angio and venous Doppler studies were completely normal echo was normal at this time we have stopped the Xarelto and I do not believe he needs further evaluation or treatments

## 2021-03-09 LAB — HEPATIC FUNCTION PANEL
ALT: 39 IU/L (ref 0–44)
AST: 31 IU/L (ref 0–40)
Albumin: 4.3 g/dL (ref 4.0–5.0)
Alkaline Phosphatase: 60 IU/L (ref 44–121)
Bilirubin Total: 0.4 mg/dL (ref 0.0–1.2)
Bilirubin, Direct: 0.12 mg/dL (ref 0.00–0.40)
Total Protein: 6.9 g/dL (ref 6.0–8.5)

## 2021-03-09 LAB — D-DIMER, QUANTITATIVE: D-DIMER: 0.41 mg/L FEU (ref 0.00–0.49)

## 2021-03-09 LAB — HCV AB W REFLEX TO QUANT PCR: HCV Ab: 0.2 s/co ratio (ref 0.0–0.9)

## 2021-03-09 LAB — HCV INTERPRETATION

## 2021-06-14 ENCOUNTER — Ambulatory Visit: Payer: Managed Care, Other (non HMO) | Admitting: Family Medicine

## 2021-08-10 ENCOUNTER — Encounter: Payer: Self-pay | Admitting: Family Medicine

## 2021-08-10 ENCOUNTER — Ambulatory Visit: Payer: Managed Care, Other (non HMO) | Admitting: Family Medicine

## 2021-08-10 VITALS — BP 118/78 | HR 95 | Temp 97.9°F | Ht 70.0 in | Wt 238.0 lb

## 2021-08-10 DIAGNOSIS — F411 Generalized anxiety disorder: Secondary | ICD-10-CM

## 2021-08-10 DIAGNOSIS — F902 Attention-deficit hyperactivity disorder, combined type: Secondary | ICD-10-CM | POA: Diagnosis not present

## 2021-08-10 MED ORDER — AMPHETAMINE-DEXTROAMPHET ER 20 MG PO CP24: 20.0000 mg | ORAL_CAPSULE | ORAL | 0 refills | Status: DC

## 2021-08-10 MED ORDER — SERTRALINE HCL 100 MG PO TABS: 100.0000 mg | ORAL_TABLET | Freq: Every day | ORAL | 2 refills | Status: DC

## 2021-08-10 NOTE — Progress Notes (Signed)
Chief Complaint  Patient presents with   New Patient (Initial Visit)    Refill Zoloft. Change ADHD medication/take over refill of this       New Patient Visit SUBJECTIVE: HPI: Brent Long is an 36 y.o.male who is being seen for establishing care.  GAD Hx of GAD on Zoloft 100 mg/d.  Reports compliance, no AE's.  No SI or HI.  No self medication. Stress with work-pressure to perform- which contributes. Mother of his child has some addiction issues which is stressful, he is a single dad at time.  Not currently exercising routinely. Not seeing a counselor at the time.   ADHD Was on Concerta 27 mg/d.  Reports daily use, compliant, no AE's.  Dx'd as a kid. Dx'd last year 2021 w [facility] and was dx'd online.    Past Medical History:  Diagnosis Date   Anxiety    GERD (gastroesophageal reflux disease)    Past Surgical History:  Procedure Laterality Date   DISTAL CLAVICLE EXCISION  2012   jones fracture     Family History  Problem Relation Age of Onset   Cancer Mother    Cancer Father    Allergies  Allergen Reactions   Penicillins     Current Outpatient Medications:    amphetamine-dextroamphetamine (ADDERALL XR) 20 MG 24 hr capsule, Take 1 capsule (20 mg total) by mouth every morning., Disp: 30 capsule, Rfl: 0   sertraline (ZOLOFT) 100 MG tablet, Take 1 tablet (100 mg total) by mouth daily., Disp: 90 tablet, Rfl: 2  OBJECTIVE: BP 118/78    Pulse 95    Temp 97.9 F (36.6 C) (Oral)    Ht 5\' 10"  (1.778 m)    Wt 238 lb (108 kg)    SpO2 97%    BMI 34.15 kg/m  General:  well developed, well nourished, in no apparent distress Skin:  no significant moles, warts, or growths Nose:  nares patent, septum midline, mucosa normal Throat/Pharynx:  lips and gingiva without lesion; tongue and uvula midline; non-inflamed pharynx; no exudates or postnasal drainage Lungs:  clear to auscultation, breath sounds equal bilaterally, no respiratory distress Cardio:  regular rate and  rhythm, no LE edema or bruits Musculoskeletal:  symmetrical muscle groups noted without atrophy or deformity Neuro:  gait normal Psych: well oriented with normal range of affect and appropriate judgment/insight  ASSESSMENT/PLAN: Attention deficit hyperactivity disorder (ADHD), combined type - Plan: amphetamine-dextroamphetamine (ADDERALL XR) 20 MG 24 hr capsule  GAD (generalized anxiety disorder) - Plan: sertraline (ZOLOFT) 100 MG tablet  Chronic, prefers to stop Concerta and try something else. Will start Adderall XR 20 mg/d. F/u in 1 mo. Chronic, stable. Cont Zoloft 100 mg/d. Counseling info pfrovided.  The patient voiced understanding and agreement to the plan.   [place], DO 08/10/21  10:20 AM

## 2021-08-10 NOTE — Patient Instructions (Signed)
Aim to do some physical exertion for 150 minutes per week. This is typically divided into 5 days per week, 30 minutes per day. The activity should be enough to get your heart rate up. Anything is better than nothing if you have time constraints.  Please consider counseling. Contact [phone] to schedule an appointment or inquire about cost/insurance coverage.  [facility] located at [address], [place], [place].  Phone number = [phone].  Dr. Onoriode Edeh - Adult Psychiatry.    [facility] located at [address], [place], [place]. Phone number = [phone].   [facility] located at [address], [place], [place].  Phone number = [phone].   [facility] located at [address], [place], [place].  Phone number = [phone].  Let us know if you need anything.  

## 2021-09-10 ENCOUNTER — Encounter: Payer: Self-pay | Admitting: Family Medicine

## 2021-09-10 ENCOUNTER — Ambulatory Visit (INDEPENDENT_AMBULATORY_CARE_PROVIDER_SITE_OTHER): Payer: Managed Care, Other (non HMO) | Admitting: Family Medicine

## 2021-09-10 VITALS — BP 132/84 | HR 88 | Temp 98.0°F | Ht 70.0 in | Wt 225.5 lb

## 2021-09-10 DIAGNOSIS — Z0000 Encounter for general adult medical examination without abnormal findings: Secondary | ICD-10-CM | POA: Diagnosis not present

## 2021-09-10 DIAGNOSIS — F902 Attention-deficit hyperactivity disorder, combined type: Secondary | ICD-10-CM | POA: Diagnosis not present

## 2021-09-10 DIAGNOSIS — R5383 Other fatigue: Secondary | ICD-10-CM

## 2021-09-10 DIAGNOSIS — Z79899 Other long term (current) drug therapy: Secondary | ICD-10-CM | POA: Diagnosis not present

## 2021-09-10 MED ORDER — AMPHETAMINE-DEXTROAMPHET ER 30 MG PO CP24: 30.0000 mg | ORAL_CAPSULE | ORAL | 0 refills | Status: DC

## 2021-09-10 NOTE — Progress Notes (Signed)
Chief Complaint  Patient presents with   Annual Exam    Well Male Brent Long is here for a complete physical.   His last physical was >1 year ago.  Current diet: in general, a overall "healthy" diet.   Current exercise: lifting weights, some cardio Weight trend: intentionally losing Fatigue out of ordinary? No. Seat belt? Yes.   Advanced directive? No  Health maintenance Tetanus- Yes HIV- Yes Hep C- Yes  Patient is interested in his testosterone levels.  He has some fatigue.  ADHD-patient with history of ADHD.  He was initially placed on Concerta which was not as helpful as in the past.  He was changed to Adderall XR 20 mg daily.  He reports better effect with this.  He reports compliance and no adverse effects.  He feels he could be a little bit better and is interested in increasing the dosage.  Past Medical History:  Diagnosis Date   Anxiety    GERD (gastroesophageal reflux disease)      Past Surgical History:  Procedure Laterality Date   DISTAL CLAVICLE EXCISION  2012   jones fracture      Medications  Current Outpatient Medications on File Prior to Visit  Medication Sig Dispense Refill   amphetamine-dextroamphetamine (ADDERALL XR) 20 MG 24 hr capsule Take 1 capsule (20 mg total) by mouth every morning. 30 capsule 0   sertraline (ZOLOFT) 100 MG tablet Take 1 tablet (100 mg total) by mouth daily. 90 tablet 2   Allergies Allergies  Allergen Reactions   Penicillins     Family History Family History  Problem Relation Age of Onset   Cancer Mother    Cancer Father     Review of Systems: Constitutional: no fevers or chills Eye:  no recent significant change in vision Ear/Nose/Mouth/Throat:  Ears:  no hearing loss Nose/Mouth/Throat:  no complaints of nasal congestion, no sore throat Cardiovascular:  no chest pain Respiratory:  no shortness of breath Gastrointestinal:  no abdominal pain, no change in bowel habits GU:  Male: negative for  dysuria Musculoskeletal/Extremities:  no pain of the joints Integumentary (Skin/Breast):  no abnormal skin lesions reported Neurologic:  no headaches Endocrine: No unexpected weight changes Hematologic/Lymphatic:  no night sweats  Exam BP 132/84    Pulse 88    Temp 98 F (36.7 C) (Oral)    Ht 5\' 10"  (1.778 m)    Wt 225 lb 8 oz (102.3 kg)    SpO2 98%    BMI 32.36 kg/m  General:  well developed, well nourished, in no apparent distress Skin:  no significant moles, warts, or growths Head:  no masses, lesions, or tenderness Eyes:  pupils equal and round, sclera anicteric without injection Ears:  canals without lesions, TMs shiny without retraction, no obvious effusion, no erythema Nose:  nares patent, septum midline, mucosa normal Throat/Pharynx:  lips and gingiva without lesion; tongue and uvula midline; non-inflamed pharynx; no exudates or postnasal drainage Neck: neck supple without adenopathy, thyromegaly, or masses Lungs:  clear to auscultation, breath sounds equal bilaterally, no respiratory distress Cardio:  regular rate and rhythm, no bruits, no LE edema Abdomen:  abdomen soft, nontender; bowel sounds normal; no masses or organomegaly Genital (male): Deferred Rectal: Deferred Musculoskeletal:  symmetrical muscle groups noted without atrophy or deformity Extremities:  no clubbing, cyanosis, or edema, no deformities, no skin discoloration Neuro:  gait normal; deep tendon reflexes normal and symmetric Psych: well oriented with normal range of affect and appropriate judgment/insight  Assessment and Plan  Well adult exam - Plan: CBC, Comprehensive metabolic panel, Lipid panel  Attention deficit hyperactivity disorder (ADHD), combined type  Fatigue, unspecified type - Plan: Testosterone  Encounter for long-term (current) use of high-risk medication - Plan: Drug Monitoring Panel [phone] , Urine, amphetamine-dextroamphetamine (ADDERALL XR) 30 MG 24 hr capsule   Well 36 y.o.  male. Counseled on diet and exercise. Self testicular exams recommended at least monthly.  Advanced directive form provided today.  Bivalent COVID vaccination booster recommended.  Politely declined flu shot.  ADHD-chronic, uncontrolled.  Increase Adderall XR from 20 mg daily to 30 mg daily.  Recheck in 1 month. The patient voiced understanding and agreement to the plan.  [place], DO 09/10/21 1:13 PM

## 2021-09-10 NOTE — Patient Instructions (Addendum)
Give us 2-3 business days to get the results of your labs back.   Keep the diet clean and stay active.  Do monthly self testicular checks in the shower. You are feeling for lumps/bumps that don't belong. If you feel anything like this, let me know!  I recommend getting the updated bivalent covid vaccination booster at your convenience.   Let us know if you need anything. 

## 2021-09-12 LAB — DRUG MONITORING PANEL 376104, URINE
Amphetamine: 1088 ng/mL — ABNORMAL HIGH (ref <250)
Amphetamines: POSITIVE ng/mL — AB (ref <500)
Barbiturates: NEGATIVE ng/mL (ref <300)
Benzodiazepines: NEGATIVE ng/mL (ref <100)
Cocaine Metabolite: NEGATIVE ng/mL (ref <150)
Desmethyltramadol: NEGATIVE ng/mL (ref <100)
Methamphetamine: NEGATIVE ng/mL (ref <250)
Opiates: NEGATIVE ng/mL (ref <100)
Oxycodone: NEGATIVE ng/mL (ref <100)
Tramadol: NEGATIVE ng/mL (ref <100)

## 2021-09-12 LAB — DM TEMPLATE

## 2021-10-08 ENCOUNTER — Other Ambulatory Visit: Payer: Managed Care, Other (non HMO)

## 2021-10-11 ENCOUNTER — Other Ambulatory Visit: Payer: Self-pay | Admitting: Family Medicine

## 2021-10-11 ENCOUNTER — Encounter: Payer: Self-pay | Admitting: Family Medicine

## 2021-10-11 ENCOUNTER — Other Ambulatory Visit (INDEPENDENT_AMBULATORY_CARE_PROVIDER_SITE_OTHER): Payer: Managed Care, Other (non HMO)

## 2021-10-11 DIAGNOSIS — Z0000 Encounter for general adult medical examination without abnormal findings: Secondary | ICD-10-CM

## 2021-10-11 DIAGNOSIS — R5383 Other fatigue: Secondary | ICD-10-CM | POA: Diagnosis not present

## 2021-10-11 DIAGNOSIS — Z79899 Other long term (current) drug therapy: Secondary | ICD-10-CM

## 2021-10-11 LAB — CBC
HCT: 46.5 % (ref 39.0–52.0)
Hemoglobin: 15.6 g/dL (ref 13.0–17.0)
MCHC: 33.5 g/dL (ref 30.0–36.0)
MCV: 87.9 fl (ref 78.0–100.0)
Platelets: 235 10*3/uL (ref 150.0–400.0)
RBC: 5.29 Mil/uL (ref 4.22–5.81)
RDW: 13.2 % (ref 11.5–15.5)
WBC: 6.2 10*3/uL (ref 4.0–10.5)

## 2021-10-11 LAB — LIPID PANEL
Cholesterol: 190 mg/dL (ref 0–200)
HDL: 46.4 mg/dL (ref >39.00)
LDL Cholesterol: 107 mg/dL — ABNORMAL HIGH (ref 0–99)
NonHDL: 143.2
Total CHOL/HDL Ratio: 4
Triglycerides: 179 mg/dL — ABNORMAL HIGH (ref 0.0–149.0)
VLDL: 35.8 mg/dL (ref 0.0–40.0)

## 2021-10-11 LAB — TESTOSTERONE: Testosterone: 387.49 ng/dL (ref 300.00–890.00)

## 2021-10-11 LAB — COMPREHENSIVE METABOLIC PANEL
ALT: 17 U/L (ref 0–53)
AST: 17 U/L (ref 0–37)
Albumin: 4.6 g/dL (ref 3.5–5.2)
Alkaline Phosphatase: 52 U/L (ref 39–117)
BUN: 16 mg/dL (ref 6–23)
CO2: 29 mEq/L (ref 19–32)
Calcium: 9.5 mg/dL (ref 8.4–10.5)
Chloride: 101 mEq/L (ref 96–112)
Creatinine, Ser: 1.05 mg/dL (ref 0.40–1.50)
GFR: 91.22 mL/min (ref >60.00)
Glucose, Bld: 91 mg/dL (ref 70–99)
Potassium: 4.3 mEq/L (ref 3.5–5.1)
Sodium: 137 mEq/L (ref 135–145)
Total Bilirubin: 0.9 mg/dL (ref 0.2–1.2)
Total Protein: 7 g/dL (ref 6.0–8.3)

## 2021-10-11 MED ORDER — AMPHETAMINE-DEXTROAMPHET ER 30 MG PO CP24: 30.0000 mg | ORAL_CAPSULE | ORAL | 0 refills | Status: DC

## 2021-10-11 NOTE — Telephone Encounter (Signed)
Requesting: Adderall XR 30mg  ?Contract:  ?UDS: 09/10/21 ?Last Visit: 09/10/21 ?Next Visit: none ?Last Refill:09/10/21 ? ?Please Advise ? ?

## 2021-10-11 NOTE — Telephone Encounter (Signed)
Scheduled appt. Per PCP. ?

## 2021-10-11 NOTE — Telephone Encounter (Signed)
I sent in another mo but would like to ck in on things in a few weeks. Plz sched at convenience. Ty.  ?

## 2021-10-20 ENCOUNTER — Encounter: Payer: Self-pay | Admitting: Family Medicine

## 2021-10-20 ENCOUNTER — Ambulatory Visit (INDEPENDENT_AMBULATORY_CARE_PROVIDER_SITE_OTHER): Payer: Managed Care, Other (non HMO) | Admitting: Family Medicine

## 2021-10-20 VITALS — BP 128/86 | HR 76 | Temp 97.6°F | Ht 70.0 in | Wt 223.4 lb

## 2021-10-20 DIAGNOSIS — F902 Attention-deficit hyperactivity disorder, combined type: Secondary | ICD-10-CM

## 2021-10-20 DIAGNOSIS — Z79899 Other long term (current) drug therapy: Secondary | ICD-10-CM | POA: Diagnosis not present

## 2021-10-20 MED ORDER — AMPHETAMINE-DEXTROAMPHET ER 30 MG PO CP24: 30.0000 mg | ORAL_CAPSULE | ORAL | 0 refills | Status: DC

## 2021-10-20 NOTE — Patient Instructions (Addendum)
Let me know when you need refills.  ? ?Consider the [facility] if you are interested in testosterone replacement.  ? ?Let us know if you need anything. ?

## 2021-10-20 NOTE — Progress Notes (Signed)
**Note Brent Long** Chief Complaint  ?Patient presents with  ? Follow-up  ?  Lab work  ? ? ?[name] is 36 y.o. male here for ADHD follow up. ? ?Patient is currently on Adderall XR 30 mg/d and compliance is excellent. ?Symptoms include inattention. ?Side effects include. ?Patient believes their dose should be unchanged . ?Denies tics, weight loss, difficulties with sleep, self-medication, alcohol/drug abuse, chest pain, or palpitations. ? ? ?Past Medical History:  ?Diagnosis Date  ? Anxiety   ? GERD (gastroesophageal reflux disease)   ? ? ?BP 128/86   Pulse 76   Temp 97.6 ?F (36.4 ?C) (Oral)   Ht 5\' 10"  (1.778 m)   Wt 223 lb 6 oz (101.3 kg)   SpO2 99%   BMI 32.05 kg/m?  ?Gen- awake, alert, appearing stated age ?Heart- RRR ?Lungs- CTAB, no accessory muscle use ?Neuro- no facial tics ?19- age appropriate judgment and insight, normal mood and affect ? ?Attention deficit hyperactivity disorder (ADHD), combined type - Plan: amphetamine-dextroamphetamine (ADDERALL XR) 30 MG 24 hr capsule, amphetamine-dextroamphetamine (ADDERALL XR) 30 MG 24 hr capsule ? ?Encounter for long-term (current) use of high-risk medication ? ?Chronic, stable. CSC updated. UDS done last mo. Cont Adderall XR 30 mg/d.  ?F/u in 6 mo or prn. ?Pt voiced understanding and agreement to the plan. ? ?Shelda Pal, DO ?10/20/21 ?11:48 AM ? ? ?

## 2022-01-26 ENCOUNTER — Other Ambulatory Visit: Payer: Self-pay | Admitting: Family Medicine

## 2022-01-26 ENCOUNTER — Other Ambulatory Visit: Payer: Self-pay

## 2022-01-26 DIAGNOSIS — F902 Attention-deficit hyperactivity disorder, combined type: Secondary | ICD-10-CM

## 2022-01-26 DIAGNOSIS — Z79899 Other long term (current) drug therapy: Secondary | ICD-10-CM

## 2022-01-26 MED ORDER — AMPHETAMINE-DEXTROAMPHET ER 30 MG PO CP24: 30.0000 mg | ORAL_CAPSULE | ORAL | 0 refills | Status: DC

## 2022-01-26 NOTE — Telephone Encounter (Signed)
Patient called into office wanting refill on medication

## 2022-03-15 ENCOUNTER — Ambulatory Visit
Admission: RE | Admit: 2022-03-15 | Discharge: 2022-03-15 | Disposition: A | Discharge status: Home / Self Care | Payer: Managed Care, Other (non HMO) | Source: Ambulatory Visit | Attending: Family Medicine | Admitting: Family Medicine

## 2022-03-15 VITALS — BP 142/86 | HR 90 | Temp 99.2°F | Resp 18

## 2022-03-15 DIAGNOSIS — Z113 Encounter for screening for infections with a predominantly sexual mode of transmission: Secondary | ICD-10-CM | POA: Diagnosis present

## 2022-03-15 DIAGNOSIS — B356 Tinea cruris: Secondary | ICD-10-CM | POA: Diagnosis present

## 2022-03-15 MED ORDER — KETOCONAZOLE 2 % EX CREA: 1.0000 | TOPICAL_CREAM | Freq: Two times a day (BID) | CUTANEOUS | 0 refills | Status: DC

## 2022-03-15 NOTE — ED Provider Notes (Signed)
[facility]    CSN: [id number] Arrival date & time: 03/15/22  1408      History   Chief Complaint Chief Complaint  Patient presents with   Rash    Entered by patient    HPI Brent Long is a 36 y.o. male.    Rash  Here for a rash and irritation on his left scrotum.  He has noted it there about 5 days.  He tried some hydrocortisone on it which may have made it worse, and then he has been putting baby powder on it that has maybe helped it some.  It was maybe a little itchy at one point.  No fever or chills.  Dysuria or penile discharge  He expresses concern for STD.  Past Medical History:  Diagnosis Date   Anxiety    GERD (gastroesophageal reflux disease)     Patient Active Problem List   Diagnosis Date Noted   Attention deficit hyperactivity disorder (ADHD), combined type 09/10/2021   GERD (gastroesophageal reflux disease) 03/08/2021   Snoring 03/08/2021   BMI 36.0-36.9,adult 02/15/2021    Past Surgical History:  Procedure Laterality Date   DISTAL CLAVICLE EXCISION  2012   jones fracture         Home Medications    Prior to Admission medications   Medication Sig Start Date End Date Taking? Authorizing Provider  ketoconazole (NIZORAL) 2 % cream Apply 1 Application topically 2 (two) times daily. To affected area until resolved, approximately 2 weeks 03/15/22  Yes Zenia Resides, MD  amphetamine-dextroamphetamine (ADDERALL XR) 30 MG 24 hr capsule Take 1 capsule (30 mg total) by mouth every morning. 01/26/22   Carmelia Roller, Jilda Roche, DO  amphetamine-dextroamphetamine (ADDERALL XR) 30 MG 24 hr capsule Take 1 capsule (30 mg total) by mouth every morning. 02/25/22   Carmelia Roller, Jilda Roche, DO  amphetamine-dextroamphetamine (ADDERALL XR) 30 MG 24 hr capsule Take 1 capsule (30 mg total) by mouth every morning. 03/27/22   Sharlene Dory, DO  sertraline (ZOLOFT) 100 MG tablet Take 1 tablet (100 mg total) by mouth daily. 08/10/21   Sharlene Dory, DO    Family History Family History  Problem Relation Age of Onset   Cancer Mother    Cancer Father     Social History Social History   Tobacco Use   Smoking status: Never   Smokeless tobacco: Never  Vaping Use   Vaping Use: Never used  Substance Use Topics   Alcohol use: Yes    Comment: <1 drink per month on average   Drug use: Never     Allergies   Penicillins   Review of Systems Review of Systems  Skin:  Positive for rash.     Physical Exam Triage Vital Signs ED Triage Vitals [03/15/22 1437]  Enc Vitals Group     BP (!) 142/86     Pulse Rate 90     Resp 18     Temp 99.2 F (37.3 C)     Temp Source Oral     SpO2 97 %     Weight      Height      Head Circumference      Peak Flow      Pain Score 1     Pain Loc      Pain Edu?      Excl. in GC?    No data found.  Updated Vital Signs BP (!) 142/86 (BP Location: Right Arm)   Pulse 90  Temp 99.2 F (37.3 C) (Oral)   Resp 18   SpO2 97%   Visual Acuity Right Eye Distance:   Left Eye Distance:   Bilateral Distance:    Right Eye Near:   Left Eye Near:    Bilateral Near:     Physical Exam Vitals reviewed.  Constitutional:      General: He is not in acute distress.    Appearance: He is not ill-appearing, toxic-appearing or diaphoretic.  Genitourinary:    Comments: There is no ulceration or nodule of the scrotum.  There is an area of confluent erythema.  The skin is not indurated. Skin:    Coloration: Skin is not jaundiced or pale.  Neurological:     Mental Status: He is alert and oriented to person, place, and time.  Psychiatric:        Behavior: Behavior normal.      UC Treatments / Results  Labs (all labs ordered are listed, but only abnormal results are displayed) Labs Reviewed  HIV ANTIBODY (ROUTINE TESTING W REFLEX)  RPR  CYTOLOGY, (ORAL, ANAL, URETHRAL) ANCILLARY ONLY    EKG   Radiology No results found.  Procedures Procedures (including critical care  time)  Medications Ordered in UC Medications - No data to display  Initial Impression / Assessment and Plan / UC Course  I have reviewed the triage vital signs and the nursing notes.  Pertinent labs & imaging results that were available during my care of the patient were reviewed by me and considered in my medical decision making (see chart for details).     His exam is consistent with tinea cruris.  Reassurance given that the symptoms he is coming in with are not indicative of any STD.  He does wish to be screened with blood work and swab for STDs, though  Staff will notify him of any positive Final Clinical Impressions(s) / UC Diagnoses   Final diagnoses:  Screen for STD (sexually transmitted disease)  Tinea cruris     Discharge Instructions      Put ketoconazole cream on the affected area 2 times daily until healed, about 2 weeks  We will notify you if there is anything positive on any of your blood tests or swab.     ED Prescriptions     Medication Sig Dispense Auth. Provider   ketoconazole (NIZORAL) 2 % cream Apply 1 Application topically 2 (two) times daily. To affected area until resolved, approximately 2 weeks 30 g Kellen Dutch, Janace Aris, MD      PDMP not reviewed this encounter.   Zenia Resides, MD 03/15/22 1515

## 2022-03-15 NOTE — Discharge Instructions (Addendum)
Put ketoconazole cream on the affected area 2 times daily until healed, about 2 weeks  We will notify you if there is anything positive on any of your blood tests or swab.

## 2022-03-15 NOTE — ED Triage Notes (Addendum)
The patient states he noticed a rash, itching and redness to his left testicle and tried hydrocortisone, and baby powder. The patient denies having any other issues.   Started: 5 days ago

## 2022-03-16 LAB — HIV ANTIBODY (ROUTINE TESTING W REFLEX): HIV Screen 4th Generation wRfx: NONREACTIVE

## 2022-03-16 LAB — CYTOLOGY, (ORAL, ANAL, URETHRAL) ANCILLARY ONLY
Chlamydia: NEGATIVE
Comment: NEGATIVE
Comment: NEGATIVE
Comment: NORMAL
Neisseria Gonorrhea: NEGATIVE
Trichomonas: NEGATIVE

## 2022-03-16 LAB — RPR: RPR Ser Ql: NONREACTIVE

## 2022-04-19 ENCOUNTER — Ambulatory Visit: Payer: Managed Care, Other (non HMO) | Admitting: Family Medicine

## 2022-06-06 ENCOUNTER — Other Ambulatory Visit: Payer: Self-pay | Admitting: Family Medicine

## 2022-06-06 DIAGNOSIS — F411 Generalized anxiety disorder: Secondary | ICD-10-CM

## 2022-06-09 ENCOUNTER — Telehealth: Payer: Self-pay

## 2022-06-09 ENCOUNTER — Encounter: Payer: Self-pay | Admitting: Family Medicine

## 2022-06-09 DIAGNOSIS — Z79899 Other long term (current) drug therapy: Secondary | ICD-10-CM

## 2022-06-09 NOTE — Telephone Encounter (Signed)
Caller Name [place] Phone Number [phone] Patient Name Brent Long Patient DOB Apr 09, 1986 Call Type Message Only Information Provided Reason for Call Medication Question / Request Initial Comment The caller states he needs the dr to resend his prescription over to the pharmacy. Additional Comment Office hours provided, Declined nurse triage. Disp. Time Disposition Final User 06/09/2022 12:54:26 PM General Information Provided Yes Quitman Livings Call Closed By: Quitman Livings Transaction Date/Time: 06/09/2022 12:50:48 PM (ET)

## 2022-06-09 NOTE — Telephone Encounter (Signed)
Medication:   amphetamine-dextroamphetamine (ADDERALL XR) 30 MG 24 hr capsule [[id number]]   Has the patient contacted their pharmacy? No. (If no, request that the patient contact the pharmacy for the refill.) (If yes, when and what did the pharmacy advise?)  Preferred Pharmacy (with phone number or street name):   [id number] - [place], [place] - [address] [place], [place] [address] Phone: [phone]  Fax: [phone]    Agent: Please be advised that RX refills may take up to 3 business days. We ask that you follow-up with your pharmacy.

## 2022-06-10 MED ORDER — AMPHETAMINE-DEXTROAMPHET ER 30 MG PO CP24: 30.0000 mg | ORAL_CAPSULE | ORAL | 0 refills | Status: DC

## 2022-06-10 NOTE — Telephone Encounter (Signed)
Will fill 1 more mo, due for visit, no more before then. Ty.

## 2022-06-10 NOTE — Telephone Encounter (Signed)
Last RF---03/27/2022 Last OV---10/20/2021

## 2022-06-10 NOTE — Telephone Encounter (Signed)
Patient informed Scheduled appt on 07/11/22.

## 2022-07-11 ENCOUNTER — Encounter: Payer: Self-pay | Admitting: Family Medicine

## 2022-07-11 ENCOUNTER — Ambulatory Visit (INDEPENDENT_AMBULATORY_CARE_PROVIDER_SITE_OTHER): Payer: Self-pay | Admitting: Family Medicine

## 2022-07-11 VITALS — BP 138/82 | HR 94 | Temp 97.7°F | Ht 70.0 in | Wt 251.0 lb

## 2022-07-11 DIAGNOSIS — F411 Generalized anxiety disorder: Secondary | ICD-10-CM

## 2022-07-11 DIAGNOSIS — Z79899 Other long term (current) drug therapy: Secondary | ICD-10-CM

## 2022-07-11 DIAGNOSIS — F902 Attention-deficit hyperactivity disorder, combined type: Secondary | ICD-10-CM

## 2022-07-11 MED ORDER — AMPHETAMINE-DEXTROAMPHET ER 30 MG PO CP24: 30.0000 mg | ORAL_CAPSULE | ORAL | 0 refills | Status: DC

## 2022-07-11 MED ORDER — PROPRANOLOL HCL 10 MG PO TABS: 10.0000 mg | ORAL_TABLET | Freq: Three times a day (TID) | ORAL | 2 refills | Status: DC | PRN

## 2022-07-11 NOTE — Progress Notes (Signed)
Chief Complaint  Patient presents with   Follow-up    6 month     Brent Long is 36 y.o. male here for ADHD follow up.  Patient is currently on Adderall XR 30 mg/d and compliance is excellent. Symptoms are well controlled.  Side effects include: none. Patient believes their dose should be not significantly changed. Denies tics, weight loss, difficulties with sleep, self-medication, alcohol/drug abuse, chest pain, or palpitations. He is interested in decreasing his dosage once his other medicines are stable.  Patient has a history of anxiety.  He has some very stressful situations going on regarding family life that he is sorting through right now.  He used to take propranolol before big meetings or presentations that worked well.  He is interested in doing this again.  He was on 10 mg and would take it around 30 minutes before presentation.  He tolerated this well.  No homicidal or suicidal ideation.  No self-medication.  Past Medical History:  Diagnosis Date   Anxiety    GERD (gastroesophageal reflux disease)     BP 138/82 (BP Location: Left Arm, Patient Position: Sitting, Cuff Size: Normal)   Pulse 94   Temp 97.7 F (36.5 C) (Oral)   Ht 5\' 10"  (1.778 m)   Wt 251 lb (113.9 kg)   SpO2 96%   BMI 36.01 kg/m  Gen- awake, alert, appearing stated age Heart- RRR Lungs- CTAB, no accessory muscle use Neuro- no facial tics Psych- age appropriate judgment and insight, normal mood and affect  Attention deficit hyperactivity disorder (ADHD), combined type - Plan: amphetamine-dextroamphetamine (ADDERALL XR) 30 MG 24 hr capsule, amphetamine-dextroamphetamine (ADDERALL XR) 30 MG 24 hr capsule  GAD (generalized anxiety disorder) - Plan: propranolol (INDERAL) 10 MG tablet  Encounter for long-term (current) use of high-risk medication - Plan: amphetamine-dextroamphetamine (ADDERALL XR) 30 MG 24 hr capsule  Chronic, stable.  Continue Adderall XR 30 mg daily.  Follow-up in 6 months. Chronic,  could be better controlled.  Start Inderal 10 mg 3 times daily as needed. Pt voiced understanding and agreement to the plan.  [place], DO 07/11/22 11:55 AM

## 2022-07-11 NOTE — Patient Instructions (Signed)
Keep the diet clean and stay active.  Aim to do some physical exertion for 150 minutes per week. This is typically divided into 5 days per week, 30 minutes per day. The activity should be enough to get your heart rate up. Anything is better than nothing if you have time constraints.  Let us know if you need anything. 

## 2022-08-17 IMAGING — CT CT ANGIO CHEST
2 of 16 series · 12 of 36 positions shown · IV contrast (omnipaque)
Comparison: None available

CLINICAL DATA: Syncopal episode while hiking, concern for acute PE
at an outside facility.

EXAM:
CT ANGIOGRAPHY CHEST WITH CONTRAST
TECHNIQUE: Multidetector CT imaging of the chest was performed using the
standard protocol during bolus administration of intravenous
contrast. Multiplanar CT image reconstructions and MIPs were
obtained to evaluate the vascular anatomy.
CONTRAST:  100mL OMNIPAQUE IOHEXOL 350 MG/ML SOLN

[Series 13: pe coronal mpr · coronal · 0.63mm/px · 1 of 132 slices shown]
[im 66/132  mediastinal]
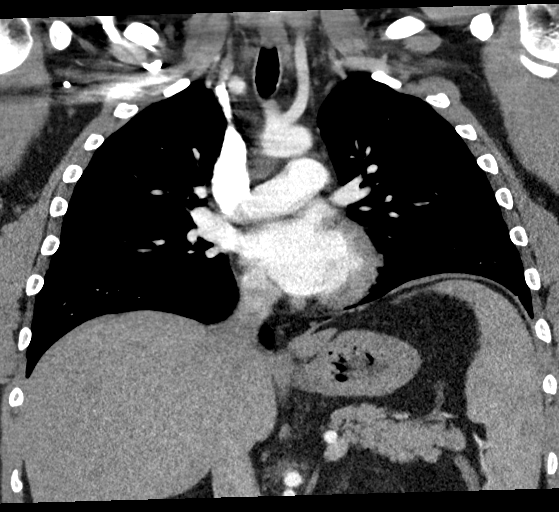

[Series 17: pe thins · axial · 0.77mm/px · z∈[-353,-97]mm · 11 of 384 slices shown]
[im 32/384  lung]
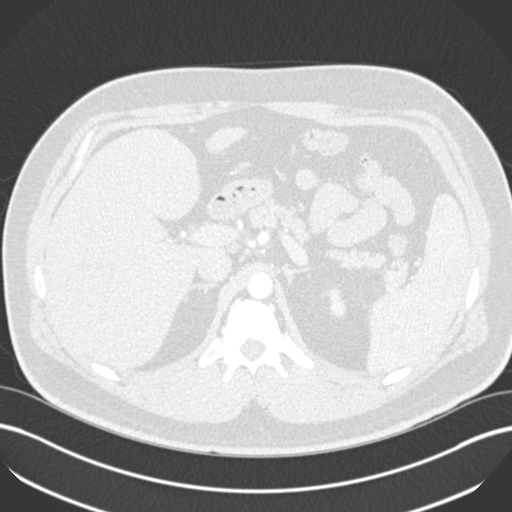
[im 64/384  mediastinal]
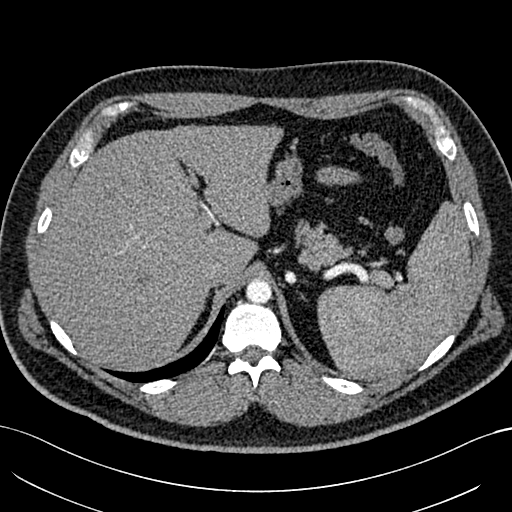
[im 96/384  lung]
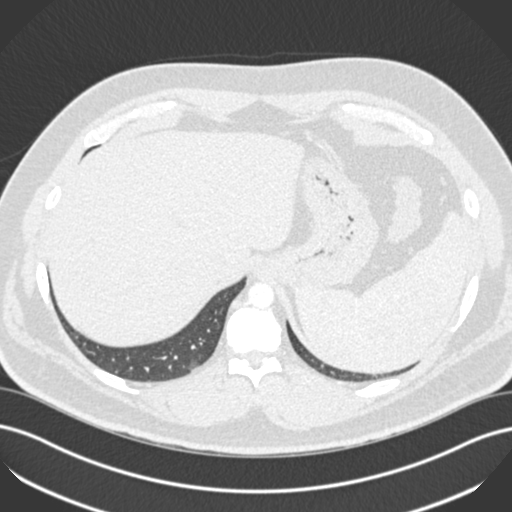
[im 128/384  mediastinal]
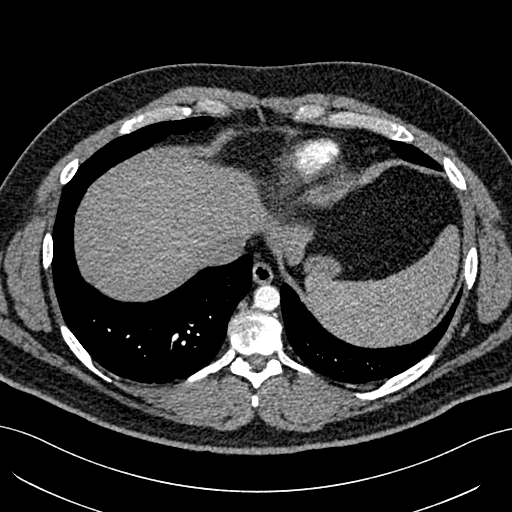
[im 160/384  lung]
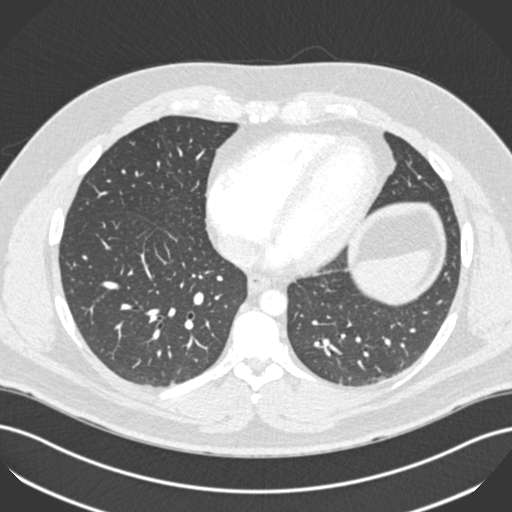
[im 192/384  mediastinal]
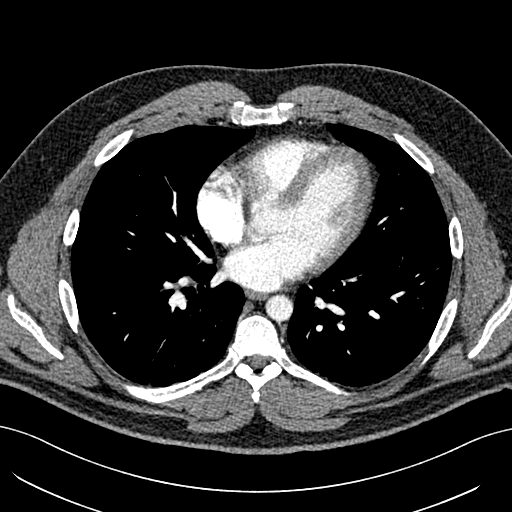
[im 224/384  lung]
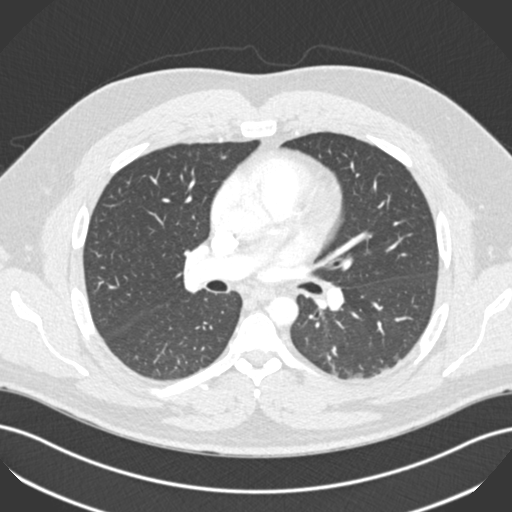
[im 256/384  mediastinal]
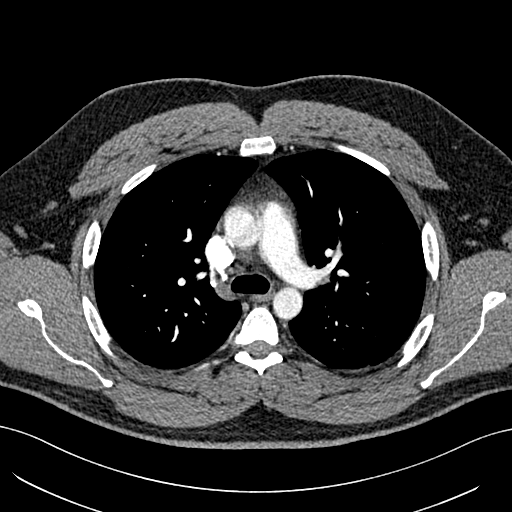
[im 288/384  lung]
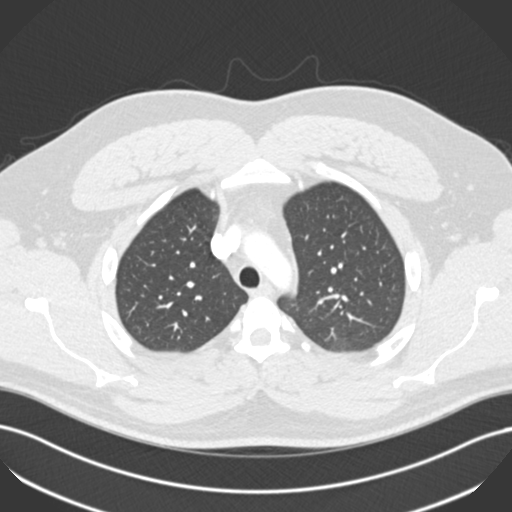
[im 320/384  mediastinal]
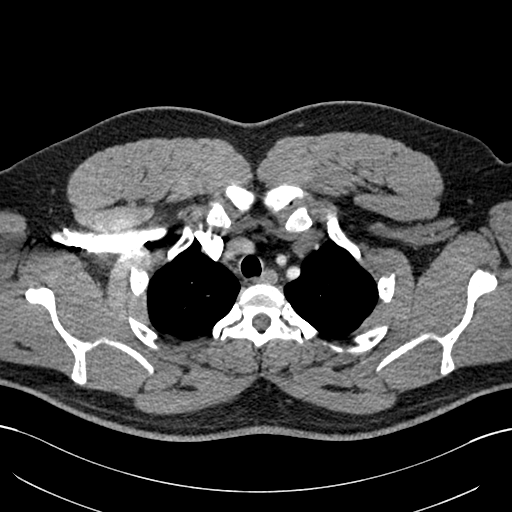
[im 352/384  lung]
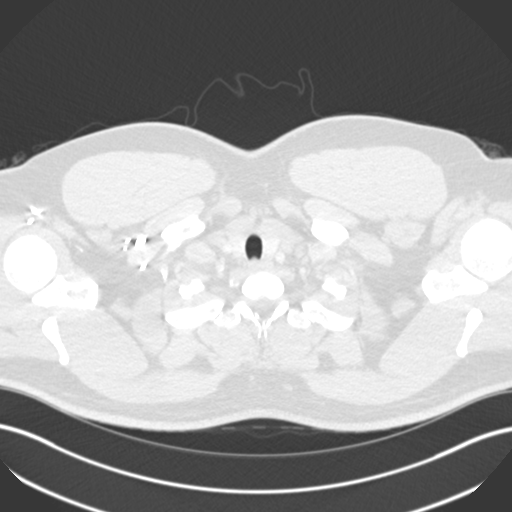

[12 of 36 positions shown; findings below may reference images not displayed]

FINDINGS: Cardiovascular: Pulmonary arteries appear patent and normal in
caliber. No significant filling defect or pulmonary embolus by CTA.

Intact thoracic aorta. Two vessel arch anatomy, normal variant.
Negative for aneurysm or dissection. Motion artifact across the
ascending aorta and aortic root.

No mediastinal hemorrhage or hematoma. Normal heart size. No
pericardial effusion.

Central venous structures are patent. No Jumper process.
Pulmonary veins are patent. No anomalous pulmonary venous return
appreciated.

Mediastinum/Nodes: No enlarged mediastinal, hilar, or axillary lymph
nodes. Thyroid gland, trachea, and esophagus demonstrate no
significant findings.

Lungs/Pleura: Minimal dependent bibasilar
atelectasis/hypoventilatory changes.

No acute airspace process, collapse or consolidation. No edema or
interstitial disease. Trachea and central airways are patent.

No pleural abnormality, effusion or pneumothorax.

Upper Abdomen: No acute abnormality.

Musculoskeletal: No chest wall abnormality. No acute or significant
osseous findings.

Review of the MIP images confirms the above findings.
IMPRESSION: Negative for significant acute pulmonary embolus by CTA.

Minor dependent basilar atelectasis.

No other acute intrathoracic finding.

## 2022-10-07 ENCOUNTER — Ambulatory Visit (INDEPENDENT_AMBULATORY_CARE_PROVIDER_SITE_OTHER): Payer: 59 | Admitting: Family Medicine

## 2022-10-07 ENCOUNTER — Encounter: Payer: Self-pay | Admitting: Family Medicine

## 2022-10-07 VITALS — BP 121/81 | HR 67 | Temp 97.4°F | Ht 70.0 in | Wt 255.5 lb

## 2022-10-07 DIAGNOSIS — R0683 Snoring: Secondary | ICD-10-CM

## 2022-10-07 DIAGNOSIS — R635 Abnormal weight gain: Secondary | ICD-10-CM | POA: Diagnosis not present

## 2022-10-07 DIAGNOSIS — R5383 Other fatigue: Secondary | ICD-10-CM

## 2022-10-07 NOTE — Progress Notes (Signed)
Chief Complaint  Patient presents with   Insomnia    Would like to discuss a sleep study Check testosterone    Subjective: Patient is a 37 y.o. male here for follow-up.  Is a stable year history of snoring.  He does not always wake up feeling refreshed.  He is fatigued throughout the day.  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  No opportunities for relations at this time.  Diet is poor.  He is not exercising routinely.  Mood is stable.  Past Medical History:  Diagnosis Date   Anxiety    GERD (gastroesophageal reflux disease)     Objective: BP 121/81 (BP Location: Left Arm, Patient Position: Sitting, Cuff Size: Large)   Pulse 67   Temp (!) 97.4 F (36.3 C) (Oral)   Ht '5\' 10"'$  (1.778 m)   Wt 255 lb 8 oz (115.9 kg)   SpO2 99%   BMI 36.66 kg/m  General: Awake, appears stated age Heart: RRR, no LE edema Lungs: CTAB, no rales, wheezes or rhonchi. No accessory muscle use Neck: Supple, no thyromegaly or asymmetry noted. Psych: Age appropriate judgment and insight, normal affect and mood  Assessment and Plan: Snoring - Plan: Ambulatory referral to Neurology  Weight gain - Plan: T4, free, TSH  Fatigue, unspecified type - Plan: T4, free, TSH, VITAMIN D 25 Hydroxy (Vit-D Deficiency, Fractures), CBC, Testosterone  Will have him come back for morning labs.  Counseled on diet and exercise.  Refer to neurology for sleep evaluation.  Follow-up pending the above. The patient voiced understanding and agreement to the plan.  [place], DO 10/07/22  1:20 PM

## 2022-10-07 NOTE — Patient Instructions (Addendum)
If you do not hear anything about your referral in the next 1-2 weeks, call our office and ask for an update.  Give [place] 2-3 business days to get the results of your labs back.   Let us know if you need anything.

## 2022-10-13 ENCOUNTER — Other Ambulatory Visit: Payer: 59

## 2022-10-28 ENCOUNTER — Other Ambulatory Visit (INDEPENDENT_AMBULATORY_CARE_PROVIDER_SITE_OTHER): Payer: 59

## 2022-10-28 ENCOUNTER — Other Ambulatory Visit: Payer: Self-pay | Admitting: Family Medicine

## 2022-10-28 DIAGNOSIS — R635 Abnormal weight gain: Secondary | ICD-10-CM | POA: Diagnosis not present

## 2022-10-28 DIAGNOSIS — E559 Vitamin D deficiency, unspecified: Secondary | ICD-10-CM

## 2022-10-28 DIAGNOSIS — R5383 Other fatigue: Secondary | ICD-10-CM | POA: Diagnosis not present

## 2022-10-28 LAB — CBC
HCT: 44.7 % (ref 39.0–52.0)
Hemoglobin: 15.2 g/dL (ref 13.0–17.0)
MCHC: 33.9 g/dL (ref 30.0–36.0)
MCV: 85.5 fl (ref 78.0–100.0)
Platelets: 285 10*3/uL (ref 150.0–400.0)
RBC: 5.23 Mil/uL (ref 4.22–5.81)
RDW: 12.9 % (ref 11.5–15.5)
WBC: 7.8 10*3/uL (ref 4.0–10.5)

## 2022-10-28 LAB — VITAMIN D 25 HYDROXY (VIT D DEFICIENCY, FRACTURES): VITD: 10.3 ng/mL — ABNORMAL LOW (ref 30.00–100.00)

## 2022-10-28 LAB — T4, FREE: Free T4: 0.83 ng/dL (ref 0.60–1.60)

## 2022-10-28 LAB — TSH: TSH: 2.75 u[IU]/mL (ref 0.35–5.50)

## 2022-10-28 LAB — TESTOSTERONE: Testosterone: 365.34 ng/dL (ref 300.00–890.00)

## 2022-10-28 MED ORDER — VITAMIN D (ERGOCALCIFEROL) 1.25 MG (50000 UNIT) PO CAPS: 50000.0000 [IU] | ORAL_CAPSULE | ORAL | 0 refills | Status: DC

## 2022-10-31 ENCOUNTER — Encounter: Payer: Self-pay | Admitting: Family Medicine

## 2022-10-31 ENCOUNTER — Other Ambulatory Visit: Payer: Self-pay | Admitting: Family Medicine

## 2022-10-31 DIAGNOSIS — Z79899 Other long term (current) drug therapy: Secondary | ICD-10-CM

## 2022-10-31 DIAGNOSIS — F902 Attention-deficit hyperactivity disorder, combined type: Secondary | ICD-10-CM

## 2022-10-31 MED ORDER — AMPHETAMINE-DEXTROAMPHET ER 30 MG PO CP24: 30.0000 mg | ORAL_CAPSULE | ORAL | 0 refills | Status: DC

## 2022-11-03 ENCOUNTER — Institutional Professional Consult (permissible substitution): Payer: 59 | Admitting: Neurology

## 2022-12-15 ENCOUNTER — Institutional Professional Consult (permissible substitution): Payer: 59 | Admitting: Neurology

## 2022-12-15 ENCOUNTER — Telehealth: Payer: Self-pay | Admitting: *Deleted

## 2022-12-15 NOTE — Telephone Encounter (Signed)
Please review. Today was patient's second missed appointment.

## 2022-12-27 ENCOUNTER — Encounter: Payer: Self-pay | Admitting: Family Medicine

## 2022-12-27 ENCOUNTER — Ambulatory Visit (INDEPENDENT_AMBULATORY_CARE_PROVIDER_SITE_OTHER): Payer: 59 | Admitting: Family Medicine

## 2022-12-27 VITALS — BP 124/78 | HR 110 | Temp 98.2°F | Resp 18 | Ht 70.0 in | Wt 258.0 lb

## 2022-12-27 DIAGNOSIS — J069 Acute upper respiratory infection, unspecified: Secondary | ICD-10-CM | POA: Diagnosis not present

## 2022-12-27 MED ORDER — PROMETHAZINE-DM 6.25-15 MG/5ML PO SYRP
5.0000 mL | ORAL_SOLUTION | Freq: Four times a day (QID) | ORAL | 0 refills | Status: DC | PRN
Start: 2022-12-27 — End: 2023-03-08

## 2022-12-27 NOTE — Progress Notes (Signed)
Chief Complaint  Patient presents with   URI    Some sore throat, cough, headache. He has felt like he has a fever, some HA x 5/17 sxs worse over the last 2 days. He says his son recently had URI from daycare. He has tried Mucinex.     Brent Long here for URI complaints.  Duration: 5 days; worsening over past 2 d Associated symptoms: sinus headache, sinus congestion, sinus pain, rhinorrhea, sore throat, shortness of breath, and productive coughing Denies: itchy watery eyes, ear pain, ear drainage, wheezing, myalgia, N/V/D, and fevers Treatment to date: Mucinex, Zycam Sick contacts: Yes- son dx'd w croup  Past Medical History:  Diagnosis Date   Anxiety    GERD (gastroesophageal reflux disease)     Objective BP 124/78 (BP Location: Left Arm, Patient Position: Sitting, Cuff Size: Large)   Pulse (!) 110   Temp 98.2 F (36.8 C) (Oral)   Resp 18   Ht 5\' 10"  (1.778 m)   Wt 258 lb (117 kg)   SpO2 98%   BMI 37.02 kg/m  General: Awake, alert, appears stated age HEENT: AT, [place], ears patent b/l and TM's neg, nares patent w/o discharge on the right, clear discharge on the left, pharynx pink and without exudates, MMM, no sinus TTP bilaterally Neck: No masses or asymmetry Heart: Regular rhythm, tachycardic Lungs: CTAB, no accessory muscle use Psych: Age appropriate judgment and insight, normal mood and affect  Viral URI with cough - Plan: promethazine-dextromethorphan (PROMETHAZINE-DM) 6.25-15 MG/5ML syrup  Push fluids, practice good hand hygiene, cover mouth when coughing.  Cough syrup as above.  Warned about drowsiness.  Send message in 2 days if no improvement.  Likely virus from son. F/u prn. If starting to experience fevers, shaking, or shortness of breath, seek immediate care. Pt voiced understanding and agreement to the plan.  Jilda Roche [name], DO 12/27/22 12:02 PM

## 2022-12-27 NOTE — Patient Instructions (Addendum)
Continue to push fluids, practice good hand hygiene, and cover your mouth if you cough.  If you start having fevers, shaking or shortness of breath, seek immediate care.  OK to take Tylenol 1000 mg (2 extra strength tabs) or 975 mg (3 regular strength tabs) every 6 hours as needed.  Ibuprofen 400-600 mg (2-3 over the counter strength tabs) every 6 hours as needed for pain.  Send me a message in 2 days if no better, sooner if worsening.   Let us know if you need anything.

## 2023-01-10 ENCOUNTER — Ambulatory Visit: Payer: Self-pay | Admitting: Family Medicine

## 2023-01-11 ENCOUNTER — Encounter: Payer: Self-pay | Admitting: Neurology

## 2023-01-11 ENCOUNTER — Ambulatory Visit (INDEPENDENT_AMBULATORY_CARE_PROVIDER_SITE_OTHER): Payer: 59 | Admitting: Neurology

## 2023-01-11 VITALS — BP 130/80 | HR 75 | Ht 70.0 in | Wt 258.0 lb

## 2023-01-11 DIAGNOSIS — G4719 Other hypersomnia: Secondary | ICD-10-CM

## 2023-01-11 DIAGNOSIS — R0683 Snoring: Secondary | ICD-10-CM | POA: Diagnosis not present

## 2023-01-11 DIAGNOSIS — R519 Headache, unspecified: Secondary | ICD-10-CM

## 2023-01-11 DIAGNOSIS — Z9189 Other specified personal risk factors, not elsewhere classified: Secondary | ICD-10-CM

## 2023-01-11 DIAGNOSIS — E669 Obesity, unspecified: Secondary | ICD-10-CM

## 2023-01-11 DIAGNOSIS — R0681 Apnea, not elsewhere classified: Secondary | ICD-10-CM | POA: Diagnosis not present

## 2023-01-11 NOTE — Progress Notes (Signed)
Subjective:    Patient ID: Brent Long is a 37 y.o. male.  HPI    Brent Foley, MD, PhD [facility] [address], [address] [address] [place], [place] [address]  Dear Dr. Carmelia Roller,  I saw your patient, Brent Long, upon your kind request in my sleep clinic today for initial consultation of his sleep disorder, in particular, concern for underlying obstructive sleep apnea.  The patient is unaccompanied today. He missed an appointment on 11/03/2022 and 12/15/22. As you know, Brent Long is a 37 year old male with an underlying medical history of ADHD, anxiety, reflux disease, low testosterone, and obesity, who reports snoring and excessive daytime somnolence, as well as witnessed apneas.  His Epworth sleepiness score is 9 out of 24, fatigue severity score is 37 out of 63. I reviewed your office note from 10/07/2022.  He works from home, works for an [employer], forensic as an Conservator, museum/gallery, evaluating morbidity and mortality.  He is single and has his 23-year-old son with him part-time.  He is not aware of any family history of sleep apnea, does not know his mom side of the family history well enough and no one on dad side had a CPAP machine.  He drinks caffeine in the form of coffee, usually 1/day and quite a bit of diet soda, about 3 cans/day.  He is a non-smoker and drinks alcohol occasionally, bedtime is between 12:30 AM and 1 AM and rise time around 7 AM.  He denies nightly nocturia but has had occasional morning headaches.  He is working on weight loss, weight has fluctuated, feels more comfortable 220 pounds.  He uses his Adderall as needed.  His Past Medical History Is Significant For: Past Medical History:  Diagnosis Date   Anxiety    GERD (gastroesophageal reflux disease)     His Past Surgical History Is Significant For: Past Surgical History:  Procedure Laterality Date   DISTAL CLAVICLE EXCISION  2012   jones fracture      His Family History Is Significant For: Family  History  Problem Relation Age of Onset   Cancer Mother    Cancer Father    Sleep apnea Neg Hx     His Social History Is Significant For: Social History   Socioeconomic History   Marital status: Single    Spouse name: Not on file   Number of children: Not on file   Years of education: Not on file   Highest education level: Not on file  Occupational History   Not on file  Tobacco Use   Smoking status: Never   Smokeless tobacco: Never  Vaping Use   Vaping Use: Never used  Substance and Sexual Activity   Alcohol use: Not Currently   Drug use: Never   Sexual activity: Not on file  Other Topics Concern   Not on file  Social History Narrative   Not on file   Social Determinants of Health   Financial Resource Strain: Not on file  Food Insecurity: Not on file  Transportation Needs: Not on file  Physical Activity: Not on file  Stress: Not on file  Social Connections: Not on file    His Allergies Are:  Allergies  Allergen Reactions   Penicillins   :   His Current Medications Are:  Outpatient Encounter Medications as of 01/11/2023  Medication Sig   amphetamine-dextroamphetamine (ADDERALL XR) 30 MG 24 hr capsule Take 1 capsule (30 mg total) by mouth every morning.   amphetamine-dextroamphetamine (ADDERALL XR) 30  MG 24 hr capsule Take 1 capsule (30 mg total) by mouth every morning.   amphetamine-dextroamphetamine (ADDERALL XR) 30 MG 24 hr capsule Take 1 capsule (30 mg total) by mouth every morning.   promethazine-dextromethorphan (PROMETHAZINE-DM) 6.25-15 MG/5ML syrup Take 5 mLs by mouth 4 (four) times daily as needed for cough.   propranolol (INDERAL) 10 MG tablet Take 1 tablet (10 mg total) by mouth 3 (three) times daily as needed (Anxiety).   sertraline (ZOLOFT) 100 MG tablet TAKE 1 TABLET(100 MG) BY MOUTH DAILY   Vitamin D, Ergocalciferol, (DRISDOL) 1.25 MG (50000 UNIT) CAPS capsule Take 1 capsule (50,000 Units total) by mouth every 7 (seven) days.   No  facility-administered encounter medications on file as of 01/11/2023.  :   Review of Systems:  Out of a complete 14 point review of systems, all are reviewed and negative with the exception of these symptoms as listed below:  Review of Systems  Neurological:        Pt here for sleep consult  Pt snores,headaches fatigue . Pt denies sleep study,CPAP,hypertension    ESS:9 FSS:27    Objective:  Neurological Exam  Physical Exam Physical Examination:   Vitals:   01/11/23 1018  BP: 130/80  Pulse: 75    General Examination: The patient is a very pleasant 37 y.o. male in no acute distress. He appears well-developed and well-nourished and well groomed.   HEENT: Normocephalic, atraumatic, pupils are equal, round and reactive to light, extraocular tracking is good without limitation to gaze excursion or nystagmus noted. Hearing is grossly intact. Face is symmetric with normal facial animation. Speech is clear with no dysarthria noted. There is no hypophonia. There is no lip, neck/head, jaw or voice tremor. Neck is supple with full range of passive and active motion. There are no carotid bruits on auscultation. Oropharynx exam reveals: mild mouth dryness, good dental hygiene and moderate airway crowding, due to small airway entry, tonsillar size of about 1-2+, wider tongue noted, mild overbite noted.  Tongue protrudes centrally and palate elevates symmetrically, Mallampati class III.  Neck circumference of 17-3/8 inches.  Chest: Clear to auscultation without wheezing, rhonchi or crackles noted.  Heart: S1+S2+0, regular and normal without murmurs, rubs or gallops noted.   Abdomen: Soft, non-tender and non-distended.  Extremities: There is no pitting edema in the distal lower extremities bilaterally.   Skin: Warm and dry without trophic changes noted.   Musculoskeletal: exam reveals no obvious joint deformities.   Neurologically:  Mental status: The patient is awake, alert and oriented in  all 4 spheres. His immediate and remote memory, attention, language skills and fund of knowledge are appropriate. There is no evidence of aphasia, agnosia, apraxia or anomia. Speech is clear with normal prosody and enunciation. Thought process is linear. Mood is normal and affect is normal.  Cranial nerves II - XII are as described above under HEENT exam.  Motor exam: Normal bulk, strength and tone is noted. There is no obvious action or resting tremor.  Fine motor skills and coordination: grossly intact.  Cerebellar testing: No dysmetria or intention tremor. There is no truncal or gait ataxia.  Sensory exam: intact to light touch in the upper and lower extremities.  Gait, station and balance: He stands easily. No veering to one side is noted. No leaning to one side is noted. Posture is age-appropriate and stance is narrow based. Gait shows normal stride length and normal pace. No problems turning are noted.   Assessment and Plan:  In  summary, Gearald Bockus is a very pleasant 37 y.o.-year old male with an underlying medical history of ADHD, anxiety, reflux disease, low testosterone, and obesity, whose history and physical exam are concerning for sleep disordered breathing, particularly obstructive sleep apnea (OSA) versus upper airway resistance syndrome (UARS) versus central sleep apnea (CSA), or mixed sleep apnea.  While a laboratory attended sleep study is typically considered "gold standard" for evaluation of sleep disordered breathing, we mutually agreed to pursue a home sleep test at this time.   I had a long chat with the patient about my findings and the diagnosis of sleep apnea, particularly OSA, its prognosis and treatment options. We talked about medical/conservative treatments, surgical interventions and non-pharmacological approaches for symptom control. I explained, in particular, the risks and ramifications of untreated moderate to severe OSA, especially with respect to developing  cardiovascular disease down the road, including congestive heart failure (CHF), difficult to treat hypertension, cardiac arrhythmias (particularly A-fib), neurovascular complications including TIA, stroke and dementia. Even type 2 diabetes has, in part, been linked to untreated OSA. Symptoms of untreated OSA may include (but may not be limited to) daytime sleepiness, nocturia (i.e. frequent nighttime urination), memory problems, mood irritability and suboptimally controlled or worsening mood disorder such as depression and/or anxiety, lack of energy, lack of motivation, physical discomfort, as well as recurrent headaches, especially morning or nocturnal headaches. We talked about the importance of maintaining a healthy lifestyle and striving for healthy weight.  In addition, we talked about the importance of striving for and maintaining good sleep hygiene, including making enough time for sleep. I recommended a sleep study at this time. I outlined the differences between a laboratory attended sleep study which is considered more comprehensive and accurate over the option of a home sleep test (HST); the latter may lead to underestimation of sleep disordered breathing in some instances and does not help with diagnosing upper airway resistance syndrome and is not accurate enough to diagnose primary central sleep apnea typically. I outlined possible surgical and non-surgical treatment options of OSA, including the use of a positive airway pressure (PAP) device (i.e. CPAP, AutoPAP/APAP or BiPAP in certain circumstances), a custom-made dental device (aka oral appliance, which would require a referral to a specialist dentist or orthodontist typically, and is generally speaking not considered for patients with full dentures or edentulous state), upper airway surgical options, such as traditional UPPP (which is not considered a first-line treatment) or the Inspire device (hypoglossal nerve stimulator, which would involve a  referral for consultation with an ENT surgeon, after careful selection, following inclusion criteria - also not first-line treatment). I explained the PAP treatment option to the patient in detail, as this is generally considered first-line treatment.  The patient indicated that he would be willing to try PAP therapy, if the need arises. I explained the importance of being compliant with PAP treatment, not only for insurance purposes but primarily to improve patient's symptoms symptoms, and for the patient's long term health benefit, including to reduce His cardiovascular risks longer-term.    We will pick up our discussion about the next steps and treatment options after testing.  We will keep him posted as to the test results by phone call and/or MyChart messaging where possible.  We will plan to follow-up in sleep clinic accordingly as well.  I answered all his questions today and the patient was in agreement.   I encouraged him to call with any interim questions, concerns, problems or updates or email [place] through  MyChart.  Generally speaking, sleep test authorizations may take up to 2 weeks, sometimes less, sometimes longer, the patient is encouraged to get in touch with [place] if they do not hear back from the sleep lab staff directly within the next 2 weeks.  Thank you very much for allowing me to participate in the care of this nice patient. If I can be of any further assistance to you please do not hesitate to call me at [phone].  Sincerely,   Brent Foley, MD, PhD

## 2023-01-11 NOTE — Patient Instructions (Signed)

## 2023-01-24 ENCOUNTER — Ambulatory Visit: Payer: 59 | Admitting: Neurology

## 2023-01-24 DIAGNOSIS — E669 Obesity, unspecified: Secondary | ICD-10-CM

## 2023-01-24 DIAGNOSIS — Z9189 Other specified personal risk factors, not elsewhere classified: Secondary | ICD-10-CM

## 2023-01-24 DIAGNOSIS — G4733 Obstructive sleep apnea (adult) (pediatric): Secondary | ICD-10-CM

## 2023-01-24 DIAGNOSIS — R0683 Snoring: Secondary | ICD-10-CM

## 2023-01-24 DIAGNOSIS — R519 Headache, unspecified: Secondary | ICD-10-CM

## 2023-01-24 DIAGNOSIS — R0681 Apnea, not elsewhere classified: Secondary | ICD-10-CM

## 2023-01-24 DIAGNOSIS — G4719 Other hypersomnia: Secondary | ICD-10-CM

## 2023-01-28 ENCOUNTER — Other Ambulatory Visit: Payer: Self-pay | Admitting: Family Medicine

## 2023-01-28 DIAGNOSIS — F411 Generalized anxiety disorder: Secondary | ICD-10-CM

## 2023-02-02 ENCOUNTER — Telehealth: Payer: Self-pay

## 2023-02-02 ENCOUNTER — Other Ambulatory Visit: Payer: 59

## 2023-02-02 NOTE — Telephone Encounter (Signed)
Zott, Stacy  Zianne Schubring, Abbe Amsterdam, CMA; Zott, Ermalinda Memos, Tammy Got It       Previous Messages    ----- Message ----- From: Bobbye Morton, CMA Sent: 02/02/2023  10:32 AM EDT To: Melvern Sample; Kennyth Arnold Zott Subject: autopap                                        New orders have been placed for the above pt, DOB: 10-22-1985 Thanks

## 2023-02-02 NOTE — Addendum Note (Signed)
Addended by: Huston Foley on: 02/02/2023 09:22 AM   Modules accepted: Orders

## 2023-02-02 NOTE — Procedures (Signed)
Follow-up  [facility]  HOME SLEEP TEST (Watch PAT) REPORT   -   Mail-out Device  STUDY DATE: 01/26/23  DOB: [birth year]  MRN: [id number]  ORDERING CLINICIAN: Huston Foley, MD, PhD   REFERRING CLINICIAN: Sharlene Dory, DO   CLINICAL INFORMATION/HISTORY: 37 year old male with an underlying medical history of ADHD, anxiety, reflux disease, low testosterone, and obesity, who reports snoring and excessive daytime somnolence, as well as witnessed apneas.   Epworth sleepiness score: 9/24.  BMI: 36.9 kg/m  FINDINGS:   Sleep Summary:   Total Recording Time (hours, min): 7 hours, 28 min  Total Sleep Time (hours, min):  6 hours, 44 min  Percent REM (%):    15.4%   Respiratory Indices:   Calculated pAHI (per hour):  6.3/hour         REM pAHI:    13.5/hour       NREM pAHI: 5/hour  Central pAHI: 1.2/hour  Oxygen Saturation Statistics:    Oxygen Saturation (%) Mean: 95%   Minimum oxygen saturation (%):                 83%   O2 Saturation Range (%): 83-98%    O2 Saturation (minutes) <=88%: 0.2 min  Pulse Rate Statistics:   Pulse Mean (bpm):    67/min    Pulse Range (50- 105/min)   IMPRESSION: OSA (obstructive sleep apnea), mild  RECOMMENDATION:  This home sleep test demonstrates overall mild obstructive sleep apnea with a total AHI of 6.3/hour and O2 nadir of 83%. Snoring was detected, and appeared to be intermittent in the mild to moderate range, rarely louder. Given the patient's medical history and sleep related complaints, therapy with a positive airway pressure device can be considered in the form of AutoPap therapy. A full night, in-lab PAP titration study may aid in improving proper treatment settings and with mask fit, if needed, down the road. Alternative treatments may include weight loss (where clinically appropriate) along with avoidance of the supine sleep position (if possible), or an oral appliance in appropriate candidates.    Please note that untreated obstructive sleep apnea may carry additional perioperative morbidity. Patients with significant obstructive sleep apnea should receive perioperative PAP therapy and the surgeons and particularly the anesthesiologist should be informed of the diagnosis and the severity of the sleep disordered breathing. The patient should be cautioned not to drive, work at heights, or operate dangerous or heavy equipment when tired or sleepy. Review and reiteration of good sleep hygiene measures should be pursued with any patient. Other causes of the patient's symptoms, including circadian rhythm disturbances, an underlying mood disorder, medication effect and/or an underlying medical problem cannot be ruled out based on this test. Clinical correlation is recommended.  The patient and his referring provider will be notified of the test results. The patient will be seen in follow up in sleep clinic at [facility], as necessary.  I certify that I have reviewed the raw data recording prior to the issuance of this report in accordance with the standards of the American Academy of Sleep Medicine (AASM).  INTERPRETING PHYSICIAN:   Huston Foley, MD, PhD Medical Director, [facility]) Diplomat, ABPN (Neurology and Sleep)   [facility] [address] [place], [place] [address] [phone]

## 2023-02-02 NOTE — Telephone Encounter (Signed)
I called pt. I advised pt that Dr.Athar  reviewed their sleep study results and found that pt has mild OSA. Dr. Frances Furbish recommends that pt trial autopap. I reviewed PAP compliance expectations with the pt. Pt is agreeable to starting a CPAP. I advised pt that an order will be sent to a DME, [facility], and [facility] will call the pt within about one week after they file with the pt's insurance. [facility] will show the pt how to use the machine, fit for masks, and troubleshoot the CPAP if needed. A follow up appt was made for insurance purposes with MM  9/26. Pt verbalized understanding to arrive 15 minutes early and bring their CPAP. Pt verbalized understanding of results. Pt had no questions at this time but was encouraged to call back if questions arise. I have sent the order to [facility] and have received confirmation that they have received the order.

## 2023-02-02 NOTE — Telephone Encounter (Signed)
-----   Message from Huston Foley, MD sent at 02/02/2023  9:22 AM EDT ----- Patient referred by Dr. Carmelia Roller, seen by me on 01/11/2023, HST 01/26/2023.    Please call and notify the patient that the recent home sleep test showed obstructive sleep apnea. OSA is overall mild, but worth treating to see if he feels better after treatment. To that end I recommend treatment for this in the form of autoPAP, which means, that we don't have to bring him in for a sleep study with CPAP, but will let him try an autoPAP machine at home, through a DME company (of his choice, or as per insurance requirement). The DME representative will educate him on how to use the machine, how to put the mask on, etc. I have placed an order in the chart. Please send referral, talk to patient, send report to referring MD. We will need a FU in sleep clinic for 10 weeks post-PAP set up, please arrange that with me or one of our NPs. Thanks,   Huston Foley, MD, PhD [facility])

## 2023-03-08 ENCOUNTER — Telehealth: Payer: Self-pay | Admitting: Family Medicine

## 2023-03-08 DIAGNOSIS — Z79899 Other long term (current) drug therapy: Secondary | ICD-10-CM

## 2023-03-08 MED ORDER — AMPHETAMINE-DEXTROAMPHET ER 30 MG PO CP24
30.0000 mg | ORAL_CAPSULE | ORAL | 0 refills | Status: DC
Start: 2023-03-08 — End: 2023-04-19

## 2023-03-08 NOTE — Telephone Encounter (Signed)
Due for visit for ADHD vs CPE. Will send 1 mo in. Ty.

## 2023-03-08 NOTE — Telephone Encounter (Signed)
Called and scheduled CPE/ADHD

## 2023-03-08 NOTE — Telephone Encounter (Signed)
**  Pt would like to change default pharmacy to the following as he is closer to this location**  Prescription Request  03/08/2023  Is this a "Controlled Substance" medicine? Yes  LOV: 12/27/2022  What is the name of the medication or equipment?   amphetamine-dextroamphetamine (ADDERALL XR) 30 MG 24 hr capsule [[id number]]   Have you contacted your pharmacy to request a refill? No   Which pharmacy would you like this sent to?   [facility] [address] [place], [place] [address] P: [phone]  Patient notified that their request is being sent to the clinical staff for review and that they should receive a response within 2 business days.   Please advise at Mobile There is no such number on file (mobile).

## 2023-03-08 NOTE — Telephone Encounter (Signed)
Last OV--12/27/22 Last RF--12/30/22

## 2023-03-08 NOTE — Addendum Note (Signed)
Addended by: Radene Gunning on: 03/08/2023 12:10 PM   Modules accepted: Orders

## 2023-03-20 ENCOUNTER — Encounter: Payer: 59 | Admitting: Family Medicine

## 2023-03-23 ENCOUNTER — Encounter (INDEPENDENT_AMBULATORY_CARE_PROVIDER_SITE_OTHER): Payer: Self-pay

## 2023-04-19 ENCOUNTER — Telehealth: Payer: Self-pay | Admitting: Family Medicine

## 2023-04-19 DIAGNOSIS — Z79899 Other long term (current) drug therapy: Secondary | ICD-10-CM

## 2023-04-19 DIAGNOSIS — F902 Attention-deficit hyperactivity disorder, combined type: Secondary | ICD-10-CM

## 2023-04-19 MED ORDER — AMPHETAMINE-DEXTROAMPHET ER 30 MG PO CP24
30.0000 mg | ORAL_CAPSULE | ORAL | 0 refills | Status: DC
Start: 2023-05-19 — End: 2023-11-16

## 2023-04-19 MED ORDER — AMPHETAMINE-DEXTROAMPHET ER 30 MG PO CP24: 30.0000 mg | ORAL_CAPSULE | ORAL | 0 refills | Status: DC

## 2023-04-19 MED ORDER — AMPHETAMINE-DEXTROAMPHET ER 30 MG PO CP24
30.0000 mg | ORAL_CAPSULE | ORAL | 0 refills | Status: DC
Start: 2023-04-19 — End: 2023-11-16

## 2023-04-19 NOTE — Telephone Encounter (Signed)
Last OV--12/27/22 Last RF--03/08/23

## 2023-04-19 NOTE — Addendum Note (Signed)
Addended by: Radene Gunning on: 04/19/2023 04:33 PM   Modules accepted: Orders

## 2023-04-19 NOTE — Telephone Encounter (Signed)
**  Pt is scheduled for appt on 9.20.24**  Prescription Request  04/19/2023  Is this a "Controlled Substance" medicine? No  LOV: 12/27/2022  What is the name of the medication or equipment?   amphetamine-dextroamphetamine (ADDERALL XR) 30 MG 24 hr capsule [[id number]]  Have you contacted your pharmacy to request a refill? No   Which pharmacy would you like this sent to?   [facility] [address] [place], [place] [address] P: [phone]  Patient notified that their request is being sent to the clinical staff for review and that they should receive a response within 2 business days.   Please advise at [facility] [phone]

## 2023-04-28 ENCOUNTER — Encounter: Payer: 59 | Admitting: Family Medicine

## 2023-05-02 ENCOUNTER — Encounter: Payer: 59 | Admitting: Adult Health

## 2023-05-04 ENCOUNTER — Encounter: Payer: 59 | Admitting: Adult Health

## 2023-05-12 ENCOUNTER — Ambulatory Visit (INDEPENDENT_AMBULATORY_CARE_PROVIDER_SITE_OTHER): Payer: 59 | Admitting: Family Medicine

## 2023-05-12 ENCOUNTER — Encounter: Payer: Self-pay | Admitting: Family Medicine

## 2023-05-12 VITALS — BP 122/68 | HR 71 | Temp 97.8°F | Ht 70.0 in | Wt 269.4 lb

## 2023-05-12 DIAGNOSIS — Z0000 Encounter for general adult medical examination without abnormal findings: Secondary | ICD-10-CM

## 2023-05-12 DIAGNOSIS — Z79899 Other long term (current) drug therapy: Secondary | ICD-10-CM | POA: Diagnosis not present

## 2023-05-12 NOTE — Patient Instructions (Signed)
Give us 2-3 business days to get the results of your labs back.   Keep the diet clean and stay active.  Please get me a copy of your advanced directive form at your convenience.   Do monthly self testicular checks in the shower. You are feeling for lumps/bumps that don't belong. If you feel anything like this, let me know!  Let us know if you need anything.  

## 2023-05-12 NOTE — Progress Notes (Signed)
Chief Complaint  Patient presents with   Follow-up   Annual Exam    Well Male Brent Long is here for a complete physical.   His last physical was >1 year ago.  Current diet: in general, a "healthy" diet.   Current exercise: none Weight trend: increased Fatigue out of ordinary? No. Seat belt? Yes.   Advanced directive? No  Health maintenance Tetanus- Yes HIV- Yes Hep C- Yes  Past Medical History:  Diagnosis Date   Anxiety    GERD (gastroesophageal reflux disease)      Past Surgical History:  Procedure Laterality Date   DISTAL CLAVICLE EXCISION  2012   jones fracture      Medications  Current Outpatient Medications on File Prior to Visit  Medication Sig Dispense Refill   [START ON 06/18/2023] amphetamine-dextroamphetamine (ADDERALL XR) 30 MG 24 hr capsule Take 1 capsule (30 mg total) by mouth every morning. 30 capsule 0   [START ON 05/19/2023] amphetamine-dextroamphetamine (ADDERALL XR) 30 MG 24 hr capsule Take 1 capsule (30 mg total) by mouth every morning. 30 capsule 0   amphetamine-dextroamphetamine (ADDERALL XR) 30 MG 24 hr capsule Take 1 capsule (30 mg total) by mouth every morning. 30 capsule 0   propranolol (INDERAL) 10 MG tablet TAKE 1 TABLET(10 MG) BY MOUTH THREE TIMES DAILY AS NEEDED FOR ANXIETY 30 tablet 2   sertraline (ZOLOFT) 100 MG tablet TAKE 1 TABLET(100 MG) BY MOUTH DAILY 90 tablet 2   Allergies Allergies  Allergen Reactions   Penicillins     Family History Family History  Problem Relation Age of Onset   Cancer Mother    Cancer Father    Sleep apnea Neg Hx     Review of Systems: Constitutional: no fevers or chills Eye:  no recent significant change in vision Ear/Nose/Mouth/Throat:  Ears:  no hearing loss Nose/Mouth/Throat:  no complaints of nasal congestion, no sore throat Cardiovascular:  no chest pain Respiratory:  no shortness of breath Gastrointestinal:  no abdominal pain, no change in bowel habits GU:  Male: negative for  dysuria Musculoskeletal/Extremities:  no pain of the joints Integumentary (Skin/Breast):  no abnormal skin lesions reported Neurologic:  no headaches Endocrine: No unexpected weight changes Hematologic/Lymphatic:  no night sweats  Exam BP 122/68 (BP Location: Left Arm, Patient Position: Sitting, Cuff Size: Large)   Pulse 71   Temp 97.8 F (36.6 C) (Oral)   Ht 5\' 10"  (1.778 m)   Wt 269 lb 6 oz (122.2 kg)   SpO2 96%   BMI 38.65 kg/m  General:  well developed, well nourished, in no apparent distress Skin:  no significant moles, warts, or growths Head:  no masses, lesions, or tenderness Eyes:  pupils equal and round, sclera anicteric without injection Ears:  canals without lesions, TMs shiny without retraction, no obvious effusion, no erythema Nose:  nares patent, mucosa normal Throat/Pharynx:  lips and gingiva without lesion; tongue and uvula midline; non-inflamed pharynx; no exudates or postnasal drainage Neck: neck supple without adenopathy, thyromegaly, or masses Lungs:  clear to auscultation, breath sounds equal bilaterally, no respiratory distress Cardio:  regular rate and rhythm, no bruits, no LE edema Abdomen:  abdomen soft, nontender; bowel sounds normal; no masses or organomegaly Genital (male): Deferred Rectal: Deferred Musculoskeletal:  symmetrical muscle groups noted without atrophy or deformity Extremities:  no clubbing, cyanosis, or edema, no deformities, no skin discoloration Neuro:  gait normal; deep tendon reflexes normal and symmetric Psych: well oriented with normal range of affect and appropriate judgment/insight  Assessment and Plan  Well adult exam - Plan: Comprehensive metabolic panel, CBC, Lipid panel  Encounter for long-term (current) use of high-risk medication - Plan: Drug Monitoring Panel [phone] , Urine   Well 37 y.o. male. Counseled on diet and exercise. Self testicular exams recommended at least monthly.  Update UDS and CSC today.  Advanced  directive form provided today.  Other orders as above. Follow up in 6 mo pending the above workup. The patient voiced understanding and agreement to the plan.  Jilda Roche [place], DO 05/12/23 1:07 PM

## 2023-05-18 ENCOUNTER — Other Ambulatory Visit: Payer: 59

## 2023-07-17 ENCOUNTER — Other Ambulatory Visit: Payer: Self-pay

## 2023-07-17 DIAGNOSIS — F411 Generalized anxiety disorder: Secondary | ICD-10-CM

## 2023-07-17 MED ORDER — SERTRALINE HCL 100 MG PO TABS
ORAL_TABLET | ORAL | 2 refills | Status: DC
Start: 2023-07-17 — End: 2023-11-22

## 2023-11-10 ENCOUNTER — Ambulatory Visit: Payer: 59 | Admitting: Family Medicine

## 2023-11-16 ENCOUNTER — Other Ambulatory Visit: Payer: Self-pay | Admitting: Family Medicine

## 2023-11-16 DIAGNOSIS — F411 Generalized anxiety disorder: Secondary | ICD-10-CM

## 2023-11-16 DIAGNOSIS — F902 Attention-deficit hyperactivity disorder, combined type: Secondary | ICD-10-CM

## 2023-11-16 DIAGNOSIS — Z79899 Other long term (current) drug therapy: Secondary | ICD-10-CM

## 2023-11-16 MED ORDER — AMPHETAMINE-DEXTROAMPHET ER 30 MG PO CP24: 30.0000 mg | ORAL_CAPSULE | ORAL | 0 refills | Status: DC

## 2023-11-16 MED ORDER — AMPHETAMINE-DEXTROAMPHET ER 30 MG PO CP24
30.0000 mg | ORAL_CAPSULE | ORAL | 0 refills | Status: DC
Start: 2024-01-15 — End: 2023-11-22

## 2023-11-16 MED ORDER — PROPRANOLOL HCL 10 MG PO TABS
10.0000 mg | ORAL_TABLET | Freq: Three times a day (TID) | ORAL | 2 refills | Status: DC | PRN
Start: 2023-11-16 — End: 2023-11-22

## 2023-11-16 MED ORDER — AMPHETAMINE-DEXTROAMPHET ER 30 MG PO CP24
30.0000 mg | ORAL_CAPSULE | ORAL | 0 refills | Status: DC
Start: 2023-11-16 — End: 2023-11-22

## 2023-11-16 NOTE — Telephone Encounter (Signed)
 Copied from CRM [id number]. Topic: Clinical - Medication Refill >> Nov 16, 2023 11:33 AM Almira Coaster wrote: Most Recent Primary Care Visit:  Provider: Sharlene Dory  Department: [facility]  Visit Type: PHYSICAL  Date: 05/12/2023  Medication: propranolol (INDERAL) 10 MG tablet, amphetamine-dextroamphetamine (ADDERALL XR) 30 MG 24 hr capsule  Has the patient contacted their pharmacy? Yes (Agent: If no, request that the patient contact the pharmacy for the refill. If patient does not wish to contact the pharmacy document the reason why and proceed with request.) (Agent: If yes, when and what did the pharmacy advise?)  Is this the correct pharmacy for this prescription? Yes If no, delete pharmacy and type the correct one.  This is the patient's preferred pharmacy:  [id number] [place], [place] - [address] [place] [address] Phone: [phone] Fax: [phone]   Has the prescription been filled recently? No  Is the patient out of the medication? Yes, patient has some court cases coming up prior to his appointment and would like the refills to be sent to his new pharmacy.   Has the patient been seen for an appointment in the last year OR does the patient have an upcoming appointment? Yes  Can we respond through MyChart? Yes  Agent: Please be advised that Rx refills may take up to 3 business days. We ask that you follow-up with your pharmacy.

## 2023-11-16 NOTE — Telephone Encounter (Signed)
 Requesting: Adderall XR 30mg   Contract: 06/12/23 UDS: 09/10/21 Last Visit: 05/12/23 Next Visit: 11/22/23 Last Refill: 06/18/23 #30 and 0RF   Please Advise

## 2023-11-22 ENCOUNTER — Ambulatory Visit (INDEPENDENT_AMBULATORY_CARE_PROVIDER_SITE_OTHER): Admitting: Family Medicine

## 2023-11-22 ENCOUNTER — Encounter: Payer: Self-pay | Admitting: Family Medicine

## 2023-11-22 VITALS — BP 126/74 | HR 95 | Temp 97.8°F | Resp 16 | Ht 70.0 in | Wt 274.2 lb

## 2023-11-22 DIAGNOSIS — E669 Obesity, unspecified: Secondary | ICD-10-CM

## 2023-11-22 DIAGNOSIS — F411 Generalized anxiety disorder: Secondary | ICD-10-CM | POA: Diagnosis not present

## 2023-11-22 DIAGNOSIS — Z6839 Body mass index (BMI) 39.0-39.9, adult: Secondary | ICD-10-CM

## 2023-11-22 DIAGNOSIS — F902 Attention-deficit hyperactivity disorder, combined type: Secondary | ICD-10-CM

## 2023-11-22 MED ORDER — AMPHETAMINE-DEXTROAMPHET ER 20 MG PO CP24
20.0000 mg | ORAL_CAPSULE | ORAL | 0 refills | Status: AC
Start: 2023-11-22 — End: ?

## 2023-11-22 MED ORDER — AMPHETAMINE-DEXTROAMPHET ER 20 MG PO CP24: 20.0000 mg | ORAL_CAPSULE | ORAL | 0 refills | Status: AC

## 2023-11-22 MED ORDER — PROPRANOLOL HCL 10 MG PO TABS: ORAL_TABLET | ORAL | 2 refills | Status: AC

## 2023-11-22 NOTE — Progress Notes (Signed)
 Chief Complaint  Patient presents with   Follow-up    Follow up    Brent Long is 38 y.o. male here for ADHD follow up.  Patient is currently on Adderall XR 30 mg daily and compliance is excellent. Symptoms are well-controlled. Side effects include perspiration. Patient believes their dose should be decreased. Denies tics, weight loss, difficulties with sleep, self-medication, alcohol/drug abuse, chest pain, or palpitations.  Patient has a history of anxiety.  He stopped taking sertraline 100 mg daily due to difficulty remembering to take.  He does take propranolol 10 mg 3 times daily as needed.  He does help somewhat.  Lots of stress in life with custody battles of his son.  No homicidal or suicidal ideation.  No self-medication.  He is not following with a therapist currently.  Past Medical History:  Diagnosis Date   Anxiety    GERD (gastroesophageal reflux disease)     BP 126/74 (BP Location: Left Arm, Patient Position: Sitting)   Pulse 95   Temp 97.8 F (36.6 C) (Oral)   Resp 16   Ht 5\' 10"  (1.778 m)   Wt 274 lb 3.2 oz (124.4 kg)   SpO2 98%   BMI 39.34 kg/m  Gen- awake, alert, appearing stated age Heart- RRR Lungs- CTAB, no accessory muscle use Neuro- no facial tics Psych- age appropriate judgment and insight, normal mood and affect  Attention deficit hyperactivity disorder (ADHD), combined type - Plan: amphetamine-dextroamphetamine (ADDERALL XR) 20 MG 24 hr capsule, amphetamine-dextroamphetamine (ADDERALL XR) 20 MG 24 hr capsule, amphetamine-dextroamphetamine (ADDERALL XR) 20 MG 24 hr capsule, Drug Monitoring Panel [phone] , Urine  GAD (generalized anxiety disorder) - Plan: propranolol (INDERAL) 10 MG tablet  Obesity (BMI 30-39.9) - Plan: CBC, Comprehensive metabolic panel with GFR, Lipid panel  Chronic, may be having adverse effects of medication.  Reduce dosage from 30 mg daily to 20 mg daily.  He will let me know if there are any issues. Chronic, relatively  well-controlled.  Okay to stop sertraline.  Increase propranolol dosage from 10 mg to 10-30 mg 3 times daily as needed.  Counseling information provided.  Counseled on exercise. Unless he needs this, I will see him in 6 months. Pt voiced understanding and agreement to the plan.  Shellie Dials [place], DO 11/22/23 3:12 PM

## 2023-11-22 NOTE — Patient Instructions (Addendum)
Keep the diet clean and stay active.  Give [place] 2-3 business days to get the results of your labs back.   Please consider counseling. Contact [phone] to schedule an appointment or inquire about cost/insurance coverage.  [facility] located at [address], [place], [place].  Phone number = [phone].  Dr. Regan Lemming - Adult Psychiatry.    [facility] located at [address] [place], [place], [place]. Phone number = [phone].   [facility] located at [address] [place], [place].  Phone number = [phone].   [facility] located at [address] [place], [place], [place].  Phone number = [phone].  Let us know if you need anything.

## 2023-11-23 ENCOUNTER — Encounter: Payer: Self-pay | Admitting: Family Medicine

## 2023-11-23 LAB — CBC
HCT: 43.9 % (ref 39.0–52.0)
Hemoglobin: 15 g/dL (ref 13.0–17.0)
MCHC: 34.2 g/dL (ref 30.0–36.0)
MCV: 85.2 fl (ref 78.0–100.0)
Platelets: 338 10*3/uL (ref 150.0–400.0)
RBC: 5.15 Mil/uL (ref 4.22–5.81)
RDW: 13.5 % (ref 11.5–15.5)
WBC: 9 10*3/uL (ref 4.0–10.5)

## 2023-11-23 LAB — COMPREHENSIVE METABOLIC PANEL WITH GFR
ALT: 25 U/L (ref 0–53)
AST: 19 U/L (ref 0–37)
Albumin: 4.6 g/dL (ref 3.5–5.2)
Alkaline Phosphatase: 65 U/L (ref 39–117)
BUN: 13 mg/dL (ref 6–23)
CO2: 26 meq/L (ref 19–32)
Calcium: 9.5 mg/dL (ref 8.4–10.5)
Chloride: 102 meq/L (ref 96–112)
Creatinine, Ser: 1.06 mg/dL (ref 0.40–1.50)
GFR: 88.85 mL/min (ref >60.00)
Glucose, Bld: 78 mg/dL (ref 70–99)
Potassium: 4.1 meq/L (ref 3.5–5.1)
Sodium: 137 meq/L (ref 135–145)
Total Bilirubin: 0.5 mg/dL (ref 0.2–1.2)
Total Protein: 7 g/dL (ref 6.0–8.3)

## 2023-11-23 LAB — LIPID PANEL
Cholesterol: 201 mg/dL — ABNORMAL HIGH (ref 0–200)
HDL: 34.3 mg/dL — ABNORMAL LOW (ref >39.00)
NonHDL: 167.09
Total CHOL/HDL Ratio: 6
Triglycerides: 465 mg/dL — ABNORMAL HIGH (ref 0.0–149.0)
VLDL: 93 mg/dL — ABNORMAL HIGH (ref 0.0–40.0)

## 2023-11-23 LAB — LDL CHOLESTEROL, DIRECT: Direct LDL: 126 mg/dL

## 2023-11-24 LAB — DRUG MONITORING PANEL 376104, URINE
Amphetamines: NEGATIVE ng/mL (ref <500)
Barbiturates: NEGATIVE ng/mL (ref <300)
Benzodiazepines: NEGATIVE ng/mL (ref <100)
Cocaine Metabolite: NEGATIVE ng/mL (ref <150)
Desmethyltramadol: NEGATIVE ng/mL (ref <100)
Opiates: NEGATIVE ng/mL (ref <100)
Oxycodone: NEGATIVE ng/mL (ref <100)
Tramadol: NEGATIVE ng/mL (ref <100)

## 2023-11-24 LAB — DM TEMPLATE

## 2023-11-27 ENCOUNTER — Other Ambulatory Visit: Payer: Self-pay

## 2023-11-27 DIAGNOSIS — E782 Mixed hyperlipidemia: Secondary | ICD-10-CM

## 2024-01-04 ENCOUNTER — Other Ambulatory Visit

## 2024-05-13 ENCOUNTER — Telehealth: Admitting: Family Medicine

## 2024-05-13 ENCOUNTER — Encounter: Payer: Self-pay | Admitting: Family Medicine

## 2024-05-13 ENCOUNTER — Ambulatory Visit: Payer: Self-pay

## 2024-05-13 DIAGNOSIS — J020 Streptococcal pharyngitis: Secondary | ICD-10-CM

## 2024-05-13 DIAGNOSIS — R221 Localized swelling, mass and lump, neck: Secondary | ICD-10-CM | POA: Diagnosis not present

## 2024-05-13 MED ORDER — PREDNISONE 20 MG PO TABS: 40.0000 mg | ORAL_TABLET | Freq: Every day | ORAL | 0 refills | Status: AC

## 2024-05-13 NOTE — Telephone Encounter (Signed)
 FYI Only or Action Required?: Action required by provider: medication refill request.  Patient was last seen in primary care on 11/22/2023 by Frann Mabel Mt, DO.  Called Nurse Triage reporting Sore Throat.  Symptoms began several days ago.  Interventions attempted: Prescription medications: Z pack.  Symptoms are: unchanged.  Triage Disposition: See PCP When Office is Open (Within 3 Days)  Patient/caregiver understands and will follow disposition?: No, wishes to speak with PCP            Copied from CRM #[id number]. Topic: Clinical - Red Word Triage >> May 13, 2024 10:14 AM Robinson H wrote: [facility] that prompted transfer to Nurse Triage: strep z pack uvula swollen and bothering him, 4 days into z pack, had this before wants to know if provider can call in a steroid of prednisone  Reason for Disposition  Prescription request for new medicine (not a refill)  Answer Assessment - Initial Assessment Questions Uvula irritation ongoing. Patient went to [facility] and is on z pack since Friday. Patient states he has swelling in that area. Patient states he may need additional medications for symptoms. This RN offered an appointment in office and pt wanted to see if PCP will send in meds first. Wants to know if he will need an additional steroid or possible prednisone . Preferred pharmacy below.   [address] [place], [place] [address] [address]   1. ONSET: When did the throat start hurting? (Hours or days ago)      Friday  2. SEVERITY: How bad is the sore throat? (Scale 1-10; mild, moderate or severe)     Moderate  3. STREP EXPOSURE: Has there been any exposure to strep within the past week? If Yes, ask: What type of contact occurred?      Yes  4. PUS ON THE TONSILS: Is there pus on the tonsils in the back of your throat?     Some white spots on the back of his throat  5. OTHER SYMPTOMS: Do you have any other symptoms? (e.g., difficulty  breathing, headache, rash)     Headache  Protocols used: Medication Question Call-A-AH

## 2024-05-13 NOTE — Progress Notes (Signed)
 SUBJECTIVE:   Brent Long is a 38 y.o. male presents to the clinic for: ST We are interacting via web portal for an electronic face-to-face visit. I verified patient's ID using 2 identifiers. Patient agreed to proceed with visit via this method. Patient is at home, I am at office. Patient and I are present for visit.   Complains of sore throat for 4 days.  Other associated symptoms: sore throat, swollen uvula.  Denies: sinus congestion, sinus pain, rhinorrhea, itchy watery eyes, ear pain, ear drainage, wheezing, shortness of breath, myalgia, and fevers Sick Contacts: son; dx'd w strep Therapy to date: Zpak, ibuprofen   Social History   Tobacco Use  Smoking Status Never  Smokeless Tobacco Never    Patient's medications, allergies, past medical, surgical, social and family histories were reviewed and updated as appropriate.  OBJECTIVE:  No conversational dyspnea Age appropriate judgment and insight Nml affect and mood  ASSESSMENT/PLAN:  Strep throat  Swollen uvula - Plan: predniSONE  (DELTASONE ) 20 MG tablet  Finishe abx.  Replace toothbrush after 24 hours of being on abx. He forgot to do this. Add 5 d pred burst 40 mg/d for swelling. Refer ENT if no better in 1 week. F/u as originally scheduled. Pt voiced understanding and agreement to the plan.  Mabel Mt [place], DO 05/13/24 4:25 PM
